# Patient Record
Sex: Male | Born: 1975 | Race: White | Hispanic: No | Marital: Single | Smoking: Current every day smoker
Health system: Southern US, Community
[De-identification: ages and names within clinical notes are randomized; demographics above are authoritative.]

## PROBLEM LIST (undated history)

## (undated) DIAGNOSIS — M1990 Unspecified osteoarthritis, unspecified site: Secondary | ICD-10-CM

## (undated) HISTORY — PX: ORIF CALCANEOUS FRACTURE: SHX5030

---

## 2008-02-07 ENCOUNTER — Emergency Department: Payer: Self-pay | Admitting: Internal Medicine

## 2010-08-24 ENCOUNTER — Emergency Department: Payer: Self-pay | Admitting: Emergency Medicine

## 2010-12-08 ENCOUNTER — Emergency Department (HOSPITAL_COMMUNITY): Payer: Self-pay

## 2010-12-08 ENCOUNTER — Inpatient Hospital Stay (HOSPITAL_COMMUNITY)
Admission: EM | Admit: 2010-12-08 | Discharge: 2010-12-14 | DRG: 493 | Disposition: A | Discharge status: Home Health | Payer: No Typology Code available for payment source | Attending: General Surgery | Admitting: General Surgery

## 2010-12-08 DIAGNOSIS — S92009A Unspecified fracture of unspecified calcaneus, initial encounter for closed fracture: Secondary | ICD-10-CM | POA: Diagnosis present

## 2010-12-08 DIAGNOSIS — F1010 Alcohol abuse, uncomplicated: Secondary | ICD-10-CM | POA: Diagnosis present

## 2010-12-08 DIAGNOSIS — F17200 Nicotine dependence, unspecified, uncomplicated: Secondary | ICD-10-CM | POA: Diagnosis present

## 2010-12-08 DIAGNOSIS — S20219A Contusion of unspecified front wall of thorax, initial encounter: Secondary | ICD-10-CM | POA: Diagnosis present

## 2010-12-08 DIAGNOSIS — S46909A Unspecified injury of unspecified muscle, fascia and tendon at shoulder and upper arm level, unspecified arm, initial encounter: Secondary | ICD-10-CM | POA: Diagnosis present

## 2010-12-08 DIAGNOSIS — S82209A Unspecified fracture of shaft of unspecified tibia, initial encounter for closed fracture: Secondary | ICD-10-CM

## 2010-12-08 DIAGNOSIS — S0003XA Contusion of scalp, initial encounter: Secondary | ICD-10-CM | POA: Diagnosis present

## 2010-12-08 DIAGNOSIS — S40029A Contusion of unspecified upper arm, initial encounter: Secondary | ICD-10-CM | POA: Diagnosis present

## 2010-12-08 DIAGNOSIS — D62 Acute posthemorrhagic anemia: Secondary | ICD-10-CM | POA: Diagnosis not present

## 2010-12-08 DIAGNOSIS — S4980XA Other specified injuries of shoulder and upper arm, unspecified arm, initial encounter: Secondary | ICD-10-CM | POA: Diagnosis present

## 2010-12-08 DIAGNOSIS — J680 Bronchitis and pneumonitis due to chemicals, gases, fumes and vapors: Secondary | ICD-10-CM | POA: Diagnosis present

## 2010-12-08 DIAGNOSIS — T5991XA Toxic effect of unspecified gases, fumes and vapors, accidental (unintentional), initial encounter: Secondary | ICD-10-CM | POA: Diagnosis present

## 2010-12-08 LAB — COMPREHENSIVE METABOLIC PANEL
Albumin: 4.3 g/dL (ref 3.5–5.2)
BUN: 14 mg/dL (ref 6–23)
Chloride: 102 mEq/L (ref 96–112)
Creatinine, Ser: 0.88 mg/dL (ref 0.50–1.35)
GFR calc Af Amer: >60 mL/min (ref >60)
GFR calc non Af Amer: >60 mL/min (ref >60)
Glucose, Bld: 123 mg/dL — ABNORMAL HIGH (ref 70–99)
Total Bilirubin: 0.6 mg/dL (ref 0.3–1.2)

## 2010-12-08 LAB — RAPID URINE DRUG SCREEN, HOSP PERFORMED
Amphetamines: NOT DETECTED
Barbiturates: NOT DETECTED
Tetrahydrocannabinol: NOT DETECTED

## 2010-12-08 LAB — PROTIME-INR: INR: 1.03 (ref 0.00–1.49)

## 2010-12-08 LAB — POCT I-STAT, CHEM 8
Calcium, Ion: 1.12 mmol/L (ref 1.12–1.32)
Glucose, Bld: 124 mg/dL — ABNORMAL HIGH (ref 70–99)
HCT: 51 % (ref 39.0–52.0)
Hemoglobin: 17.3 g/dL — ABNORMAL HIGH (ref 13.0–17.0)

## 2010-12-08 LAB — CK TOTAL AND CKMB (NOT AT ARMC): Total CK: 1780 U/L — ABNORMAL HIGH (ref 7–232)

## 2010-12-08 LAB — CBC
HCT: 46.6 % (ref 39.0–52.0)
MCHC: 36.1 g/dL — ABNORMAL HIGH (ref 30.0–36.0)
RDW: 12.2 % (ref 11.5–15.5)

## 2010-12-08 LAB — TROPONIN I: Troponin I: <0.3 ng/mL (ref <0.30)

## 2010-12-08 LAB — ETHANOL: Alcohol, Ethyl (B): <11 mg/dL (ref 0–11)

## 2010-12-08 MED ORDER — IOHEXOL 300 MG/ML  SOLN
100.0000 mL | Freq: Once | INTRAMUSCULAR | Status: AC | PRN
  Administered 2010-12-08: 100 mL via INTRAVENOUS

## 2010-12-09 ENCOUNTER — Inpatient Hospital Stay (HOSPITAL_COMMUNITY): Payer: No Typology Code available for payment source

## 2010-12-09 LAB — CBC
Platelets: 277 10*3/uL (ref 150–400)
RBC: 4.7 MIL/uL (ref 4.22–5.81)
RDW: 12.2 % (ref 11.5–15.5)
WBC: 20.1 10*3/uL — ABNORMAL HIGH (ref 4.0–10.5)

## 2010-12-11 ENCOUNTER — Inpatient Hospital Stay (HOSPITAL_COMMUNITY): Payer: No Typology Code available for payment source

## 2010-12-11 LAB — MRSA PCR SCREENING: MRSA by PCR: NEGATIVE

## 2010-12-12 LAB — CBC
MCH: 30 pg (ref 26.0–34.0)
Platelets: 298 10*3/uL (ref 150–400)
RBC: 3.97 MIL/uL — ABNORMAL LOW (ref 4.22–5.81)
RDW: 11.8 % (ref 11.5–15.5)
WBC: 16.4 10*3/uL — ABNORMAL HIGH (ref 4.0–10.5)

## 2010-12-18 NOTE — Op Note (Signed)
Anthony Santos, [name]                 ACCOUNT NO.:  [id number]  MEDICAL RECORD NO.:  [id number]  LOCATION:  5024                         FACILITY:  [facility]  PHYSICIAN:  Shlomie Romig C. Ophelia Charter, M.D.    DATE OF BIRTH:  [birth year]  DATE OF PROCEDURE:  12/11/2010 DATE OF DISCHARGE:                              OPERATIVE REPORT   PREOPERATIVE DIAGNOSES:  Right calcaneus fracture with tuberosity placement; right tibial, lateral tibial plateau, and tibial eminence fracture.  POSTOPERATIVE DIAGNOSES:  Right calcaneus fracture with tuberosity placement; right tibial, lateral tibial plateau, and tibial eminence fracture.  PROCEDURE:  ORIF, right calcaneus and arthroscopic-assisted reduction and fixation of right tibial eminence and lateral tibial plateau fracture.  SURGEON:  Felisia Balcom C. Ophelia Charter, M.D.  ANESTHESIA:  GOT plus general anesthesia.  EBL:  Minimal.  TOURNIQUET TIME:  Less than 1 hour, it was 47 minutes.  PROCEDURE IN DETAIL:  After induction of general anesthesia orotracheal intubation, preoperative Ancef prophylaxis, proximal thigh tourniquet application, standard prepping and draping from the tourniquet to the tip of the toes, gloves was placed over the toes for isolation.  Leg was wrapped in Esmarch after time-out procedure was completed and skin marker was used for planned incision.  The plantar and lateral skin border was followed, starting at the Achilles tendon even with the subtalar joint and then following along the inferior border of the calcaneus.  Tissue was cut all the way down to the bone and then elevated off the bone including peroneal tendons.  K-wires were drilled into the inferior aspect of the talus for retraction and been over x2. The posterior joint and anterior subtalar joint were both anatomic with the fragment was proximally migrated.  This was grasped, pulled down with ankle dorsiflexed made this easier, and pieces were reduced. Multiple K-wires were placed, total  of seven or eight K-wires to finally put the calcaneus back together.  Calcaneal plate was then selected Biomet and checked under fluoroscopy.  Multiple screws were then inserted along the tuberosity down and fragments in good position. After irrigation with saline solution, skin was closed with nylon sutures and impervious stockinette and Coban was applied.  Inflow was placed through the scope and arthroscopic portals were made, medial and lateral parapatellar tendon.  Medial lateral plateau fractures were checked, the lateral plateau was underneath the edge of the meniscus, meniscus was elevated, and the fragment was anatomic.  On the medial aspect, the tibial eminence fracture came to the midportion of the joint and there was no step-off either anteriorly or posteriorly.  A 1.5-cm incision was made laterally over the midportion of the plateau.  K-wire was drilled and two 5-mm screws were placed, cannulated with the more proximal one having a washer.  These were measured out of cortex drilled and screws were inserted, tightened down.  Fluoroscopic pictures taken, demonstrating reduction.  The lateral crack did not be visualized, the tibial eminence piece was supported, scope was introduced in, fragments were in good position.  Lachman was negative.  Anterior drawer was negative.  Knee was suctioned dry.  Nylon sutures placed in the portal and 3 simple nylon sutures were placed in the lateral incision for the  screw fixation.  Arthroscope was used to check position after screws were inserted and there was a compression.  The patient tolerated the procedure well; and 4x4s, Webril, Ace wraps to the knee was applied; and Xeroform 4x4s, fluffs in the large 12 inches cotton roll followed by Coban.  Instrument count and needle count was correct.  The patient tolerated procedure well.     Sanad Fearnow C. Ophelia Charter, M.D.     MCY/MEDQ  D:  12/11/2010  T:  12/11/2010  Job:  [id number]  Electronically  Signed by Annell Greening M.D. on 12/18/2010 04:10:17 PM
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
## 2010-12-20 ENCOUNTER — Telehealth (INDEPENDENT_AMBULATORY_CARE_PROVIDER_SITE_OTHER): Payer: Self-pay | Admitting: Physician Assistant

## 2010-12-20 MED ORDER — HYDROCODONE-ACETAMINOPHEN 10-325 MG PO TABS: ORAL_TABLET | ORAL | Status: AC

## 2010-12-20 NOTE — Telephone Encounter (Signed)
Spoke with pt's mother who reports pt. Is not following up with Dr. Ophelia Charter until next week and he is almost out of pain meds. Called in Norco 10/325mg  1-2 po q4hrs prn pain # 80 no refills with further meds from Dr. Ophelia Charter. Called in to pharmacy [facility] [phone]
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
## 2011-06-21 ENCOUNTER — Other Ambulatory Visit (HOSPITAL_COMMUNITY): Payer: Self-pay | Admitting: Orthopaedic Surgery

## 2011-06-21 ENCOUNTER — Encounter (HOSPITAL_COMMUNITY): Payer: Self-pay | Admitting: *Deleted

## 2011-06-21 MED ORDER — CEFAZOLIN SODIUM 1-5 GM-% IV SOLN
1.0000 g | INTRAVENOUS | Status: AC
  Administered 2011-06-22: 1 g via INTRAVENOUS
  Filled 2011-06-21: qty 50

## 2011-06-21 MED ORDER — CHLORHEXIDINE GLUCONATE 4 % EX LIQD: 60.0000 mL | Freq: Once | CUTANEOUS | Status: DC

## 2011-06-22 ENCOUNTER — Ambulatory Visit (HOSPITAL_COMMUNITY): Payer: Self-pay | Admitting: Anesthesiology

## 2011-06-22 ENCOUNTER — Encounter (HOSPITAL_COMMUNITY): Payer: Self-pay | Admitting: Anesthesiology

## 2011-06-22 ENCOUNTER — Encounter (HOSPITAL_COMMUNITY)
Admission: RE | Disposition: A | Discharge status: Home / Self Care | Payer: Self-pay | Source: Ambulatory Visit | Attending: Orthopaedic Surgery

## 2011-06-22 ENCOUNTER — Ambulatory Visit (HOSPITAL_COMMUNITY): Payer: Self-pay

## 2011-06-22 ENCOUNTER — Observation Stay (HOSPITAL_COMMUNITY)
Admission: RE | Admit: 2011-06-22 | Discharge: 2011-06-24 | Disposition: A | Discharge status: Home / Self Care | Payer: MEDICAID | Source: Ambulatory Visit | Attending: Orthopaedic Surgery | Admitting: Orthopaedic Surgery

## 2011-06-22 ENCOUNTER — Encounter (HOSPITAL_COMMUNITY): Payer: Self-pay | Admitting: *Deleted

## 2011-06-22 DIAGNOSIS — IMO0002 Reserved for concepts with insufficient information to code with codable children: Principal | ICD-10-CM | POA: Insufficient documentation

## 2011-06-22 DIAGNOSIS — S92001A Unspecified fracture of right calcaneus, initial encounter for closed fracture: Secondary | ICD-10-CM

## 2011-06-22 HISTORY — PX: ORIF CALCANEOUS FRACTURE: SHX5030

## 2011-06-22 LAB — BASIC METABOLIC PANEL
BUN: 9 mg/dL (ref 6–23)
Chloride: 104 mEq/L (ref 96–112)
GFR calc Af Amer: >90 mL/min (ref >90)
GFR calc non Af Amer: >90 mL/min (ref >90)
Potassium: 3.5 mEq/L (ref 3.5–5.1)
Sodium: 139 mEq/L (ref 135–145)

## 2011-06-22 LAB — CBC
HCT: 46.8 % (ref 39.0–52.0)
Hemoglobin: 16.8 g/dL (ref 13.0–17.0)
MCHC: 35.9 g/dL (ref 30.0–36.0)
RDW: 12.8 % (ref 11.5–15.5)
WBC: 16.3 10*3/uL — ABNORMAL HIGH (ref 4.0–10.5)

## 2011-06-22 LAB — SURGICAL PCR SCREEN
MRSA, PCR: NEGATIVE
Staphylococcus aureus: NEGATIVE

## 2011-06-22 SURGERY — OPEN REDUCTION INTERNAL FIXATION (ORIF) CALCANEOUS FRACTURE
Anesthesia: General | Site: Foot | Laterality: Right | Wound class: Clean

## 2011-06-22 MED ORDER — GLYCOPYRROLATE 0.2 MG/ML IJ SOLN
INTRAMUSCULAR | Status: DC | PRN
  Administered 2011-06-22: .5 mg via INTRAVENOUS

## 2011-06-22 MED ORDER — METOCLOPRAMIDE HCL 5 MG/ML IJ SOLN
5.0000 mg | Freq: Three times a day (TID) | INTRAMUSCULAR | Status: DC | PRN
  Filled 2011-06-22: qty 2

## 2011-06-22 MED ORDER — DIPHENHYDRAMINE HCL 12.5 MG/5ML PO ELIX
12.5000 mg | ORAL_SOLUTION | ORAL | Status: DC | PRN
  Filled 2011-06-22: qty 10

## 2011-06-22 MED ORDER — BISACODYL 10 MG RE SUPP: 10.0000 mg | Freq: Every day | RECTAL | Status: DC | PRN

## 2011-06-22 MED ORDER — DOCUSATE SODIUM 100 MG PO CAPS
100.0000 mg | ORAL_CAPSULE | Freq: Two times a day (BID) | ORAL | Status: DC
  Administered 2011-06-22 – 2011-06-24 (×5): 100 mg via ORAL
  Filled 2011-06-22 (×5): qty 1

## 2011-06-22 MED ORDER — DIPHENHYDRAMINE HCL 12.5 MG/5ML PO ELIX
12.5000 mg | ORAL_SOLUTION | Freq: Four times a day (QID) | ORAL | Status: DC | PRN
  Filled 2011-06-22: qty 5

## 2011-06-22 MED ORDER — METOCLOPRAMIDE HCL 10 MG PO TABS: 5.0000 mg | ORAL_TABLET | Freq: Three times a day (TID) | ORAL | Status: DC | PRN

## 2011-06-22 MED ORDER — NEOSTIGMINE METHYLSULFATE 1 MG/ML IJ SOLN
INTRAMUSCULAR | Status: DC | PRN
  Administered 2011-06-22: 3 mg via INTRAVENOUS

## 2011-06-22 MED ORDER — OXYCODONE-ACETAMINOPHEN 5-325 MG PO TABS
1.0000 | ORAL_TABLET | ORAL | Status: DC | PRN
  Administered 2011-06-22 – 2011-06-24 (×4): 2 via ORAL
  Filled 2011-06-22 (×4): qty 2

## 2011-06-22 MED ORDER — MORPHINE SULFATE (PF) 1 MG/ML IV SOLN
INTRAVENOUS | Status: DC
  Administered 2011-06-22: 14:00:00 via INTRAVENOUS
  Filled 2011-06-22: qty 25

## 2011-06-22 MED ORDER — BUPIVACAINE HCL (PF) 0.25 % IJ SOLN
INTRAMUSCULAR | Status: DC | PRN
  Administered 2011-06-22: 9 mL

## 2011-06-22 MED ORDER — ONDANSETRON HCL 4 MG/2ML IJ SOLN: 4.0000 mg | Freq: Four times a day (QID) | INTRAMUSCULAR | Status: DC | PRN

## 2011-06-22 MED ORDER — SENNOSIDES-DOCUSATE SODIUM 8.6-50 MG PO TABS: 1.0000 | ORAL_TABLET | Freq: Every evening | ORAL | Status: DC | PRN

## 2011-06-22 MED ORDER — HYDROMORPHONE HCL PF 1 MG/ML IJ SOLN
INTRAMUSCULAR | Status: AC
  Administered 2011-06-22: 0.5 mg via INTRAVENOUS
  Filled 2011-06-22: qty 1

## 2011-06-22 MED ORDER — METHOCARBAMOL 500 MG PO TABS
500.0000 mg | ORAL_TABLET | Freq: Four times a day (QID) | ORAL | Status: DC | PRN
  Administered 2011-06-22 – 2011-06-23 (×3): 500 mg via ORAL
  Filled 2011-06-22 (×3): qty 1

## 2011-06-22 MED ORDER — HYDROCODONE-ACETAMINOPHEN 5-325 MG PO TABS: 1.0000 | ORAL_TABLET | ORAL | Status: DC | PRN

## 2011-06-22 MED ORDER — 0.9 % SODIUM CHLORIDE (POUR BTL) OPTIME
TOPICAL | Status: DC | PRN
  Administered 2011-06-22: 1000 mL

## 2011-06-22 MED ORDER — METHOCARBAMOL 100 MG/ML IJ SOLN
500.0000 mg | INTRAVENOUS | Status: AC
  Filled 2011-06-22: qty 5

## 2011-06-22 MED ORDER — LACTATED RINGERS IV SOLN
INTRAVENOUS | Status: DC
  Administered 2011-06-22: 09:00:00 via INTRAVENOUS

## 2011-06-22 MED ORDER — NALOXONE HCL 0.4 MG/ML IJ SOLN: 0.4000 mg | INTRAMUSCULAR | Status: DC | PRN

## 2011-06-22 MED ORDER — MUPIROCIN 2 % EX OINT
TOPICAL_OINTMENT | Freq: Two times a day (BID) | CUTANEOUS | Status: DC
  Administered 2011-06-22: 1 via NASAL

## 2011-06-22 MED ORDER — MUPIROCIN 2 % EX OINT
TOPICAL_OINTMENT | CUTANEOUS | Status: AC
  Administered 2011-06-22: 1 via NASAL
  Filled 2011-06-22: qty 22

## 2011-06-22 MED ORDER — FENTANYL CITRATE 0.05 MG/ML IJ SOLN
INTRAMUSCULAR | Status: DC | PRN
  Administered 2011-06-22: 50 ug via INTRAVENOUS
  Administered 2011-06-22: 100 ug via INTRAVENOUS
  Administered 2011-06-22 (×4): 50 ug via INTRAVENOUS

## 2011-06-22 MED ORDER — METHOCARBAMOL 100 MG/ML IJ SOLN
500.0000 mg | Freq: Four times a day (QID) | INTRAVENOUS | Status: DC | PRN
  Filled 2011-06-22: qty 5

## 2011-06-22 MED ORDER — ROCURONIUM BROMIDE 100 MG/10ML IV SOLN
INTRAVENOUS | Status: DC | PRN
  Administered 2011-06-22: 5 mg via INTRAVENOUS
  Administered 2011-06-22: 50 mg via INTRAVENOUS
  Administered 2011-06-22: 10 mg via INTRAVENOUS

## 2011-06-22 MED ORDER — MIDAZOLAM HCL 5 MG/5ML IJ SOLN
INTRAMUSCULAR | Status: DC | PRN
  Administered 2011-06-22: 2 mg via INTRAVENOUS

## 2011-06-22 MED ORDER — ZOLPIDEM TARTRATE 5 MG PO TABS: 5.0000 mg | ORAL_TABLET | Freq: Every evening | ORAL | Status: DC | PRN

## 2011-06-22 MED ORDER — HYDROMORPHONE HCL PF 1 MG/ML IJ SOLN
0.2500 mg | INTRAMUSCULAR | Status: DC | PRN
  Administered 2011-06-22 (×3): 0.5 mg via INTRAVENOUS

## 2011-06-22 MED ORDER — LACTATED RINGERS IV SOLN
INTRAVENOUS | Status: DC | PRN
  Administered 2011-06-22 (×2): via INTRAVENOUS

## 2011-06-22 MED ORDER — ONDANSETRON HCL 4 MG PO TABS: 4.0000 mg | ORAL_TABLET | Freq: Four times a day (QID) | ORAL | Status: DC | PRN

## 2011-06-22 MED ORDER — MORPHINE SULFATE (PF) 1 MG/ML IV SOLN
INTRAVENOUS | Status: DC
  Administered 2011-06-22: 11.43 mg via INTRAVENOUS
  Administered 2011-06-23: 09:00:00 via INTRAVENOUS
  Administered 2011-06-23: 6 mg via INTRAVENOUS
  Administered 2011-06-23: 3 mg via INTRAVENOUS
  Administered 2011-06-23: 12 mg via INTRAVENOUS
  Administered 2011-06-23: 3 mg via INTRAVENOUS
  Filled 2011-06-22 (×3): qty 25

## 2011-06-22 MED ORDER — PROPOFOL 10 MG/ML IV EMUL
INTRAVENOUS | Status: DC | PRN
  Administered 2011-06-22: 200 mg via INTRAVENOUS

## 2011-06-22 MED ORDER — DIPHENHYDRAMINE HCL 50 MG/ML IJ SOLN
12.5000 mg | Freq: Four times a day (QID) | INTRAMUSCULAR | Status: DC | PRN
  Administered 2011-06-23: 12.5 mg via INTRAVENOUS
  Filled 2011-06-22 (×2): qty 1

## 2011-06-22 MED ORDER — SODIUM CHLORIDE 0.9 % IJ SOLN: 9.0000 mL | INTRAMUSCULAR | Status: DC | PRN

## 2011-06-22 MED ORDER — CEFAZOLIN SODIUM 1-5 GM-% IV SOLN
1.0000 g | Freq: Four times a day (QID) | INTRAVENOUS | Status: AC
  Administered 2011-06-22 – 2011-06-23 (×3): 1 g via INTRAVENOUS
  Filled 2011-06-22 (×3): qty 50

## 2011-06-22 SURGICAL SUPPLY — 78 items
BANDAGE ELASTIC 4 VELCRO ST LF (GAUZE/BANDAGES/DRESSINGS) ×2 IMPLANT
BANDAGE ELASTIC 6 VELCRO ST LF (GAUZE/BANDAGES/DRESSINGS) ×2 IMPLANT
BANDAGE ESMARK 6X9 LF (GAUZE/BANDAGES/DRESSINGS) ×1 IMPLANT
BIT DRILL 2.5X2.75 QC CALB (BIT) ×2 IMPLANT
BIT DRILL 2.9 CANN QC NONSTRL (BIT) ×2 IMPLANT
BIT DRILL CALIBRATED 2.7 (BIT) ×2 IMPLANT
BNDG COHESIVE 6X5 TAN STRL LF (GAUZE/BANDAGES/DRESSINGS) ×2 IMPLANT
BNDG ESMARK 6X9 LF (GAUZE/BANDAGES/DRESSINGS) ×2
BONE CANC CHIPS 20CC PCAN1/4 (Bone Implant) ×2 IMPLANT
CHIPS CANC BONE 20CC PCAN1/4 (Bone Implant) ×1 IMPLANT
CLOTH BEACON ORANGE TIMEOUT ST (SAFETY) ×2 IMPLANT
COTTONBALL LARGE BULK UNSTER (GAUZE/BANDAGES/DRESSINGS) ×2 IMPLANT
COVER MAYO STAND STRL (DRAPES) ×2 IMPLANT
COVER SURGICAL LIGHT HANDLE (MISCELLANEOUS) ×4 IMPLANT
CUFF TOURNIQUET SINGLE 34IN LL (TOURNIQUET CUFF) ×2 IMPLANT
CUFF TOURNIQUET SINGLE 44IN (TOURNIQUET CUFF) IMPLANT
DRAPE C-ARM 42X72 X-RAY (DRAPES) ×2 IMPLANT
DRAPE INCISE IOBAN 66X45 STRL (DRAPES) ×4 IMPLANT
DRAPE PROXIMA HALF (DRAPES) IMPLANT
DRAPE SURG 17X23 STRL (DRAPES) ×6 IMPLANT
DRAPE U-SHAPE 47X51 STRL (DRAPES) ×2 IMPLANT
DRSG PAD ABDOMINAL 8X10 ST (GAUZE/BANDAGES/DRESSINGS) ×2 IMPLANT
DURAPREP 26ML APPLICATOR (WOUND CARE) ×2 IMPLANT
ELECT CAUTERY BLADE 6.4 (BLADE) ×2 IMPLANT
ELECT REM PT RETURN 9FT ADLT (ELECTROSURGICAL) ×2
ELECTRODE REM PT RTRN 9FT ADLT (ELECTROSURGICAL) ×1 IMPLANT
GAUZE SPONGE 4X4 12PLY STRL LF (GAUZE/BANDAGES/DRESSINGS) ×2 IMPLANT
GAUZE XEROFORM 5X9 LF (GAUZE/BANDAGES/DRESSINGS) ×2 IMPLANT
GLOVE BIOGEL PI IND STRL 7.0 (GLOVE) ×1 IMPLANT
GLOVE BIOGEL PI IND STRL 7.5 (GLOVE) ×1 IMPLANT
GLOVE BIOGEL PI IND STRL 8 (GLOVE) ×1 IMPLANT
GLOVE BIOGEL PI INDICATOR 7.0 (GLOVE) ×1
GLOVE BIOGEL PI INDICATOR 7.5 (GLOVE) ×1
GLOVE BIOGEL PI INDICATOR 8 (GLOVE) ×1
GLOVE ECLIPSE 7.0 STRL STRAW (GLOVE) ×2 IMPLANT
GLOVE EXAM NITRILE XS STR PU (GLOVE) ×2 IMPLANT
GLOVE ORTHO TXT STRL SZ7.5 (GLOVE) ×2 IMPLANT
GLOVE SURG SS PI 6.5 STRL IVOR (GLOVE) ×2 IMPLANT
GOWN PREVENTION PLUS LG XLONG (DISPOSABLE) ×2 IMPLANT
GOWN PREVENTION PLUS XLARGE (GOWN DISPOSABLE) ×2 IMPLANT
GOWN STRL NON-REIN LRG LVL3 (GOWN DISPOSABLE) ×4 IMPLANT
K-WIRE ACE 1.6X6 (WIRE) ×6
KIT BASIN OR (CUSTOM PROCEDURE TRAY) ×2 IMPLANT
KIT ROOM TURNOVER OR (KITS) ×2 IMPLANT
KWIRE ACE 1.6X6 (WIRE) ×3 IMPLANT
MANIFOLD NEPTUNE II (INSTRUMENTS) ×2 IMPLANT
NEEDLE HYPO 25GX1X1/2 BEV (NEEDLE) ×2 IMPLANT
NS IRRIG 1000ML POUR BTL (IV SOLUTION) ×2 IMPLANT
PACK ORTHO EXTREMITY (CUSTOM PROCEDURE TRAY) ×2 IMPLANT
PAD ARMBOARD 7.5X6 YLW CONV (MISCELLANEOUS) ×4 IMPLANT
PAD CAST 4YDX4 CTTN HI CHSV (CAST SUPPLIES) IMPLANT
PADDING CAST COTTON 4X4 STRL (CAST SUPPLIES)
PADDING CAST COTTON 6X4 STRL (CAST SUPPLIES) IMPLANT
PADDING WEBRIL 4 STERILE (GAUZE/BANDAGES/DRESSINGS) ×2 IMPLANT
PADDING WEBRIL 6 STERILE (GAUZE/BANDAGES/DRESSINGS) ×2 IMPLANT
PLATE LOCK SM RT CALC FOOT (Plate) ×2 IMPLANT
SCREW ACE CAN 4.0 60M (Screw) ×2 IMPLANT
SCREW CORT 3.5X40 (Screw) ×4 IMPLANT
SCREW LOCK CORT STAR 3.5X10 (Screw) ×2 IMPLANT
SCREW LOCK CORT STAR 3.5X30 (Screw) ×2 IMPLANT
SCREW LOCK CORT STAR 3.5X32 (Screw) ×4 IMPLANT
SCREW LOCK CORT STAR 3.5X34 (Screw) ×2 IMPLANT
SCREW LOCK CORT STAR 3.5X40 (Screw) ×2 IMPLANT
SCREW LOW PROFILE 3.5MMX42 (Screw) ×2 IMPLANT
SCREW LP 3.5 (Screw) ×2 IMPLANT
SPONGE GAUZE 4X4 12PLY (GAUZE/BANDAGES/DRESSINGS) ×2 IMPLANT
SPONGE LAP 18X18 X RAY DECT (DISPOSABLE) ×2 IMPLANT
STAPLER VISISTAT 35W (STAPLE) IMPLANT
SUCTION FRAZIER TIP 10 FR DISP (SUCTIONS) ×2 IMPLANT
SUT ETHILON 3 0 PS 1 (SUTURE) ×6 IMPLANT
SUT VIC AB 2-0 CT1 27 (SUTURE) ×1
SUT VIC AB 2-0 CT1 TAPERPNT 27 (SUTURE) ×1 IMPLANT
SYR CONTROL 10ML LL (SYRINGE) ×2 IMPLANT
TOWEL OR 17X24 6PK STRL BLUE (TOWEL DISPOSABLE) ×2 IMPLANT
TOWEL OR 17X26 10 PK STRL BLUE (TOWEL DISPOSABLE) ×2 IMPLANT
TUBE CONNECTING 12X1/4 (SUCTIONS) ×2 IMPLANT
WATER STERILE IRR 1000ML POUR (IV SOLUTION) IMPLANT
YANKAUER SUCT BULB TIP NO VENT (SUCTIONS) ×2 IMPLANT

## 2011-06-22 NOTE — Preoperative (Signed)
Beta Blockers   Reason not to administer Beta Blockers:Not Applicable 

## 2011-06-22 NOTE — Anesthesia Preprocedure Evaluation (Addendum)
Anesthesia Evaluation  Patient identified by MRN, date of birth, ID band Patient awake    Reviewed: Allergy & Precautions, H&P , NPO status , Patient's Chart, lab work & pertinent test results  Airway Mallampati: II  Neck ROM: full    Dental   Pulmonary          Cardiovascular     Neuro/Psych    GI/Hepatic   Endo/Other    Renal/GU      Musculoskeletal   Abdominal   Peds  Hematology   Anesthesia Other Findings   Reproductive/Obstetrics                           Anesthesia Physical Anesthesia Plan  ASA: I  Anesthesia Plan: General   Post-op Pain Management:    Induction: Intravenous  Airway Management Planned: Oral ETT  Additional Equipment:   Intra-op Plan:   Post-operative Plan: Extubation in OR  Informed Consent: I have reviewed the patients History and Physical, chart, labs and discussed the procedure including the risks, benefits and alternatives for the proposed anesthesia with the patient or authorized representative who has indicated his/her understanding and acceptance.   Dental advisory given  Plan Discussed with: CRNA, Surgeon and Anesthesiologist  Anesthesia Plan Comments:        Anesthesia Quick Evaluation
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
## 2011-06-22 NOTE — Anesthesia Procedure Notes (Signed)
Procedure Name: Intubation Date/Time: 06/22/2011 10:08 AM Performed by: Julianne Rice K Pre-anesthesia Checklist: Patient identified, Timeout performed, Emergency Drugs available, Suction available and Patient being monitored Patient Re-evaluated:Patient Re-evaluated prior to inductionOxygen Delivery Method: Circle System Utilized Preoxygenation: Pre-oxygenation with 100% oxygen Intubation Type: IV induction Ventilation: Mask ventilation without difficulty Laryngoscope Size: Mac and 4 Grade View: Grade I Tube type: Oral Tube size: 8.0 mm Number of attempts: 1 Airway Equipment and Method: stylet Placement Confirmation: ETT inserted through vocal cords under direct vision,  positive ETCO2 and breath sounds checked- equal and bilateral Secured at: 24 cm Tube secured with: Tape Dental Injury: Teeth and Oropharynx as per pre-operative assessment

## 2011-06-22 NOTE — Brief Op Note (Signed)
06/22/2011  12:02 PM  PATIENT:  Anthony Santos  36 y.o. male  PRE-OPERATIVE DIAGNOSIS:  Right Calcaneus Nonunion  POST-OPERATIVE DIAGNOSIS:  Right Calcaneus Nonunion  PROCEDURE:  Procedure(s): OPEN REDUCTION INTERNAL FIXATION (ORIF) CALCANEOUS FRACTURE TAKE DOWN NONUNION AND BONE GRAFTING   SURGEON:  Surgeon(s): Eldred Manges, MD  PHYSICIAN ASSISTANT: Mahek Schlesinger PAC  ASSISTANTS: none   ANESTHESIA:   general  EBL:  Total I/O In: 1000 [I.V.:1000] Out: 50 [Blood:50]  BLOOD ADMINISTERED:none  DRAINS: none   LOCAL MEDICATIONS USED:  NONE  SPECIMEN:  No Specimen  DISPOSITION OF SPECIMEN:  N/A  COUNTS:  YES  TOURNIQUET:   Total Tourniquet Time Documented: Thigh (Right) - 85 minutes  DICTATION: .Note written in EPIC  PLAN OF CARE: Admit for overnight observation  PATIENT DISPOSITION:  PACU - hemodynamically stable.   Delay start of Pharmacological VTE agent (>24hrs) due to surgical blood loss or risk of bleeding:  {YES/NO/NOT APPLICABLE:20182

## 2011-06-22 NOTE — Anesthesia Postprocedure Evaluation (Signed)
Anesthesia Post Note  Patient: Anthony Santos  Procedure(s) Performed:  OPEN REDUCTION INTERNAL FIXATION (ORIF) CALCANEOUS FRACTURE - Takedown Nonunion Right Calcaneus  Anesthesia type: General  Patient location: PACU  Post pain: Pain level controlled and Adequate analgesia  Post assessment: Post-op Vital signs reviewed, Patient's Cardiovascular Status Stable, Respiratory Function Stable, Patent Airway and Pain level controlled  Last Vitals:  Filed Vitals:   06/22/11 1305  BP: 130/69  Pulse: 88  Temp:   Resp: 21    Post vital signs: Reviewed and stable  Level of consciousness: awake, alert  and oriented  Complications: No apparent anesthesia complications

## 2011-06-22 NOTE — Transfer of Care (Signed)
Immediate Anesthesia Transfer of Care Note  Patient: Anthony Santos  Procedure(s) Performed:  OPEN REDUCTION INTERNAL FIXATION (ORIF) CALCANEOUS FRACTURE - Takedown Nonunion Right Calcaneus  Patient Location: PACU  Anesthesia Type: General  Level of Consciousness: awake, alert  and oriented  Airway & Oxygen Therapy: Patient Spontanous Breathing and Patient connected to nasal cannula oxygen  Post-op Assessment: Report given to PACU RN, Post -op Vital signs reviewed and stable and Patient moving all extremities  Post vital signs: Reviewed and stable  Complications: No apparent anesthesia complications

## 2011-06-23 MED ORDER — MEPROBAMATE 200 MG PO TABS
200.0000 mg | ORAL_TABLET | Freq: Four times a day (QID) | ORAL | Status: DC
  Administered 2011-06-23 – 2011-06-24 (×3): 200 mg via ORAL
  Filled 2011-06-23 (×4): qty 1

## 2011-06-23 MED ORDER — ASPIRIN-ACETAMINOPHEN-CAFFEINE 250-250-65 MG PO TABS
1.0000 | ORAL_TABLET | Freq: Four times a day (QID) | ORAL | Status: DC | PRN
  Administered 2011-06-23: 1 via ORAL
  Filled 2011-06-23 (×2): qty 1

## 2011-06-23 NOTE — Progress Notes (Signed)
Subjective: 1 Day Post-Op Procedure(s) (LRB): OPEN REDUCTION INTERNAL FIXATION (ORIF) CALCANEOUS FRACTURE (Right)  Patient reports pain as moderate. Wants to know when he is to go home.    Objective:   VITALS:  Temp:  [97.4 F (36.3 C)-100.3 F (37.9 C)] 100.3 F (37.9 C) (01/26 0705) Pulse Rate:  [81-116] 94  (01/26 0705) Resp:  [12-27] 16  (01/26 0921) BP: (100-130)/(64-82) 100/68 mmHg (01/26 0705) SpO2:  [92 %-100 %] 97 % (01/26 0921)  Neurologically intact ABD soft Sensation intact distally Intact pulses distally Dorsiflexion/Plantar flexion intact Compartment soft   LABS  Basename 06/22/11 0730  HGB 16.8  WBC 16.3*  PLT 274    Basename 06/22/11 0730  NA 139  K 3.5  CL 104  CO2 22  BUN 9  CREATININE 0.83  GLUCOSE 99   No results found for this basename: LABPT:2,INR:2 in the last 72 hours   Assessment/Plan: 1 Day Post-Op Procedure(s) (LRB): OPEN REDUCTION INTERNAL FIXATION (ORIF) CALCANEOUS FRACTURE (Right)  Advance diet Up with therapy D/C IV fluids Plan for discharge tomorrow  Maryhelen Lindler E 06/23/2011, 12:13 PM

## 2011-06-24 MED ORDER — HYDROCODONE-ACETAMINOPHEN 5-325 MG PO TABS: 1.0000 | ORAL_TABLET | ORAL | Status: AC | PRN

## 2011-06-24 NOTE — Progress Notes (Signed)
Subjective: 2 Days Post-Op Procedure(s) (LRB): OPEN REDUCTION INTERNAL FIXATION (ORIF) CALCANEOUS FRACTURE (Right)  Patient reports pain as moderate.    Objective:   VITALS:  Temp:  [97.7 F (36.5 C)-99 F (37.2 C)] 98.8 F (37.1 C) (01/27 0515) Pulse Rate:  [76-86] 76  (01/27 0515) Resp:  [16-18] 18  (01/27 0515) BP: (101-111)/(56-73) 111/73 mmHg (01/27 0515) SpO2:  [9 %-99 %] 9 % (01/27 0515)  Neurologically intact ABD soft Neurovascular intact Sensation intact distally Dorsiflexion/Plantar flexion intact Incision: dressing C/D/I   LABS  Basename 06/22/11 0730  HGB 16.8  WBC 16.3*  PLT 274    Basename 06/22/11 0730  NA 139  K 3.5  CL 104  CO2 22  BUN 9  CREATININE 0.83  GLUCOSE 99   No results found for this basename: LABPT:2,INR:2 in the last 72 hours   Assessment/Plan: 2 Days Post-Op Procedure(s) (LRB): OPEN REDUCTION INTERNAL FIXATION (ORIF) CALCANEOUS FRACTURE (Right)  Up with therapy D/C IV fluids Discharge home with home health  Mylei Brackeen E 06/24/2011, 11:46 AM

## 2011-06-24 NOTE — Progress Notes (Signed)
Orthopedic Tech Progress Note Patient Details:  Anthony Santos April 03, 1976 [id number]     Trapeze bar [name], [name] 06/24/2011, 11:09 AM
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
## 2011-06-24 NOTE — Progress Notes (Signed)
Orthopedic Tech Progress Note Patient Details:  Anthony Santos [birth year] [id number]  Other Ortho Devices Type of Ortho Device: Crutches Ortho Device Interventions: Adjustment   [name], [name] 06/24/2011, 1:07 PM

## 2011-06-26 ENCOUNTER — Encounter (HOSPITAL_COMMUNITY): Payer: Self-pay | Admitting: Orthopaedic Surgery
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
## 2011-07-24 ENCOUNTER — Other Ambulatory Visit (HOSPITAL_COMMUNITY): Payer: Self-pay | Admitting: Orthopedic Surgery

## 2011-07-24 ENCOUNTER — Encounter (HOSPITAL_COMMUNITY): Payer: Self-pay | Admitting: Pharmacy Technician

## 2011-07-25 ENCOUNTER — Encounter (HOSPITAL_COMMUNITY): Payer: Self-pay | Admitting: *Deleted

## 2011-07-25 MED ORDER — CEFAZOLIN SODIUM 1-5 GM-% IV SOLN
1.0000 g | INTRAVENOUS | Status: AC
  Administered 2011-07-26: 1 g via INTRAVENOUS
  Filled 2011-07-25: qty 50

## 2011-07-26 ENCOUNTER — Encounter (HOSPITAL_COMMUNITY): Payer: Self-pay | Admitting: Anesthesiology

## 2011-07-26 ENCOUNTER — Encounter (HOSPITAL_COMMUNITY): Payer: Self-pay | Admitting: *Deleted

## 2011-07-26 ENCOUNTER — Encounter (HOSPITAL_COMMUNITY)
Admission: RE | Disposition: A | Discharge status: Home / Self Care | Payer: Self-pay | Source: Ambulatory Visit | Attending: Orthopedic Surgery

## 2011-07-26 ENCOUNTER — Other Ambulatory Visit: Payer: Self-pay

## 2011-07-26 ENCOUNTER — Ambulatory Visit (HOSPITAL_COMMUNITY): Payer: No Typology Code available for payment source | Admitting: Anesthesiology

## 2011-07-26 ENCOUNTER — Inpatient Hospital Stay (HOSPITAL_COMMUNITY)
Admission: RE | Admit: 2011-07-26 | Discharge: 2011-07-30 | DRG: 904 | Disposition: A | Discharge status: Home / Self Care | Payer: No Typology Code available for payment source | Source: Ambulatory Visit | Attending: Orthopedic Surgery | Admitting: Orthopedic Surgery

## 2011-07-26 DIAGNOSIS — M86179 Other acute osteomyelitis, unspecified ankle and foot: Secondary | ICD-10-CM

## 2011-07-26 DIAGNOSIS — T81329A Deep disruption or dehiscence of operation wound, unspecified, initial encounter: Principal | ICD-10-CM | POA: Diagnosis present

## 2011-07-26 DIAGNOSIS — M869 Osteomyelitis, unspecified: Secondary | ICD-10-CM | POA: Diagnosis present

## 2011-07-26 DIAGNOSIS — F17200 Nicotine dependence, unspecified, uncomplicated: Secondary | ICD-10-CM | POA: Diagnosis present

## 2011-07-26 DIAGNOSIS — Y838 Other surgical procedures as the cause of abnormal reaction of the patient, or of later complication, without mention of misadventure at the time of the procedure: Secondary | ICD-10-CM | POA: Diagnosis present

## 2011-07-26 DIAGNOSIS — T8132XA Disruption of internal operation (surgical) wound, not elsewhere classified, initial encounter: Principal | ICD-10-CM | POA: Diagnosis present

## 2011-07-26 HISTORY — PX: HARDWARE REMOVAL: SHX979

## 2011-07-26 LAB — CBC
Hemoglobin: 16 g/dL (ref 13.0–17.0)
MCH: 30.3 pg (ref 26.0–34.0)
MCHC: 35.2 g/dL (ref 30.0–36.0)
RDW: 12.9 % (ref 11.5–15.5)

## 2011-07-26 LAB — COMPREHENSIVE METABOLIC PANEL
ALT: 38 U/L (ref 0–53)
Albumin: 3.9 g/dL (ref 3.5–5.2)
BUN: 10 mg/dL (ref 6–23)
Calcium: 10 mg/dL (ref 8.4–10.5)
GFR calc Af Amer: >90 mL/min (ref >90)
Glucose, Bld: 97 mg/dL (ref 70–99)
Potassium: 3.9 mEq/L (ref 3.5–5.1)
Sodium: 138 mEq/L (ref 135–145)
Total Protein: 7.3 g/dL (ref 6.0–8.3)

## 2011-07-26 LAB — PROTIME-INR: Prothrombin Time: 13.3 seconds (ref 11.6–15.2)

## 2011-07-26 LAB — APTT: aPTT: 25 seconds (ref 24–37)

## 2011-07-26 SURGERY — REMOVAL, HARDWARE
Anesthesia: General | Site: Foot | Laterality: Right | Wound class: Dirty or Infected

## 2011-07-26 MED ORDER — MORPHINE SULFATE 10 MG/ML IJ SOLN
INTRAMUSCULAR | Status: DC | PRN
  Administered 2011-07-26: 1 mg via INTRAVENOUS
  Administered 2011-07-26: 4 mg via INTRAVENOUS

## 2011-07-26 MED ORDER — HYDROMORPHONE HCL PF 1 MG/ML IJ SOLN
0.2500 mg | INTRAMUSCULAR | Status: DC | PRN
  Administered 2011-07-26 (×2): 0.5 mg via INTRAVENOUS

## 2011-07-26 MED ORDER — MIDAZOLAM HCL 5 MG/5ML IJ SOLN
INTRAMUSCULAR | Status: DC | PRN
  Administered 2011-07-26: 2 mg via INTRAVENOUS

## 2011-07-26 MED ORDER — WARFARIN SODIUM 10 MG PO TABS
10.0000 mg | ORAL_TABLET | ORAL | Status: AC
  Administered 2011-07-27: 10 mg via ORAL
  Filled 2011-07-26: qty 1

## 2011-07-26 MED ORDER — VANCOMYCIN HCL 500 MG IV SOLR
500.0000 mg | INTRAVENOUS | Status: DC
Start: 2011-07-26 — End: 2011-07-26
  Filled 2011-07-26: qty 500

## 2011-07-26 MED ORDER — OXYCODONE-ACETAMINOPHEN 5-325 MG PO TABS
1.0000 | ORAL_TABLET | ORAL | Status: DC | PRN
  Administered 2011-07-29 (×2): 2 via ORAL
  Filled 2011-07-26 (×2): qty 2

## 2011-07-26 MED ORDER — CEFAZOLIN SODIUM 1-5 GM-% IV SOLN
1.0000 g | Freq: Four times a day (QID) | INTRAVENOUS | Status: AC
  Administered 2011-07-27 (×3): 1 g via INTRAVENOUS
  Filled 2011-07-26 (×3): qty 50

## 2011-07-26 MED ORDER — ONDANSETRON HCL 4 MG/2ML IJ SOLN: 4.0000 mg | Freq: Once | INTRAMUSCULAR | Status: DC | PRN

## 2011-07-26 MED ORDER — MORPHINE SULFATE 4 MG/ML IJ SOLN: 0.0500 mg/kg | INTRAMUSCULAR | Status: DC | PRN

## 2011-07-26 MED ORDER — ONDANSETRON HCL 4 MG/2ML IJ SOLN: 4.0000 mg | Freq: Four times a day (QID) | INTRAMUSCULAR | Status: DC | PRN

## 2011-07-26 MED ORDER — METOCLOPRAMIDE HCL 5 MG/ML IJ SOLN
5.0000 mg | Freq: Three times a day (TID) | INTRAMUSCULAR | Status: DC | PRN
Start: 2011-07-26 — End: 2011-07-30
  Administered 2011-07-27: 10 mg via INTRAVENOUS
  Filled 2011-07-26: qty 2

## 2011-07-26 MED ORDER — WARFARIN VIDEO
Freq: Once | Status: AC
  Administered 2011-07-26: 23:00:00

## 2011-07-26 MED ORDER — GENTAMICIN SULFATE 40 MG/ML IJ SOLN
INTRAMUSCULAR | Status: DC | PRN
  Administered 2011-07-26: 80 mg

## 2011-07-26 MED ORDER — HYDROCODONE-ACETAMINOPHEN 5-325 MG PO TABS
1.0000 | ORAL_TABLET | ORAL | Status: DC | PRN
  Administered 2011-07-27: 2 via ORAL
  Administered 2011-07-27: 1 via ORAL
  Administered 2011-07-27 – 2011-07-30 (×8): 2 via ORAL
  Filled 2011-07-26: qty 1
  Filled 2011-07-26 (×9): qty 2

## 2011-07-26 MED ORDER — ONDANSETRON HCL 4 MG PO TABS
4.0000 mg | ORAL_TABLET | Freq: Four times a day (QID) | ORAL | Status: DC | PRN
  Administered 2011-07-27: 4 mg via ORAL
  Filled 2011-07-26: qty 1

## 2011-07-26 MED ORDER — MEPERIDINE HCL 25 MG/ML IJ SOLN
6.2500 mg | INTRAMUSCULAR | Status: DC | PRN
Start: 2011-07-26 — End: 2011-07-26

## 2011-07-26 MED ORDER — LACTATED RINGERS IV SOLN
INTRAVENOUS | Status: DC | PRN
  Administered 2011-07-26: 19:00:00 via INTRAVENOUS

## 2011-07-26 MED ORDER — PROPOFOL 10 MG/ML IV BOLUS
INTRAVENOUS | Status: DC | PRN
  Administered 2011-07-26: 200 mg via INTRAVENOUS

## 2011-07-26 MED ORDER — FENTANYL CITRATE 0.05 MG/ML IJ SOLN
INTRAMUSCULAR | Status: DC | PRN
  Administered 2011-07-26: 50 ug via INTRAVENOUS
  Administered 2011-07-26 (×2): 100 ug via INTRAVENOUS

## 2011-07-26 MED ORDER — COUMADIN BOOK
Freq: Once | Status: AC
  Administered 2011-07-27: 01:00:00
  Filled 2011-07-26: qty 1

## 2011-07-26 MED ORDER — VANCOMYCIN HCL 500 MG IV SOLR
INTRAVENOUS | Status: DC | PRN
  Administered 2011-07-26: 500 mg

## 2011-07-26 MED ORDER — SODIUM CHLORIDE 0.9 % IV SOLN
INTRAVENOUS | Status: DC
  Administered 2011-07-27: 20 mL via INTRAVENOUS

## 2011-07-26 MED ORDER — METOCLOPRAMIDE HCL 10 MG PO TABS
5.0000 mg | ORAL_TABLET | Freq: Three times a day (TID) | ORAL | Status: DC | PRN
Start: 2011-07-26 — End: 2011-07-30

## 2011-07-26 MED ORDER — HYDROMORPHONE HCL PF 1 MG/ML IJ SOLN
0.5000 mg | INTRAMUSCULAR | Status: DC | PRN
  Administered 2011-07-27 – 2011-07-28 (×3): 1 mg via INTRAVENOUS
  Filled 2011-07-26 (×3): qty 1

## 2011-07-26 SURGICAL SUPPLY — 54 items
BANDAGE ELASTIC 4 VELCRO ST LF (GAUZE/BANDAGES/DRESSINGS) IMPLANT
BANDAGE ELASTIC 6 VELCRO ST LF (GAUZE/BANDAGES/DRESSINGS) IMPLANT
BANDAGE ESMARK 6X9 LF (GAUZE/BANDAGES/DRESSINGS) IMPLANT
BANDAGE GAUZE ELAST BULKY 4 IN (GAUZE/BANDAGES/DRESSINGS) ×2 IMPLANT
BNDG COHESIVE 4X5 TAN STRL (GAUZE/BANDAGES/DRESSINGS) IMPLANT
BNDG ESMARK 6X9 LF (GAUZE/BANDAGES/DRESSINGS)
CANISTER WOUND CARE 500ML ATS (WOUND CARE) ×2 IMPLANT
CLOTH BEACON ORANGE TIMEOUT ST (SAFETY) ×2 IMPLANT
COVER SURGICAL LIGHT HANDLE (MISCELLANEOUS) ×2 IMPLANT
CUFF TOURNIQUET SINGLE 34IN LL (TOURNIQUET CUFF) IMPLANT
CUFF TOURNIQUET SINGLE 44IN (TOURNIQUET CUFF) IMPLANT
DRAPE C-ARM 42X72 X-RAY (DRAPES) IMPLANT
DRAPE INCISE IOBAN 66X45 STRL (DRAPES) IMPLANT
DRAPE OEC MINIVIEW 54X84 (DRAPES) ×2 IMPLANT
DRAPE ORTHO SPLIT 77X108 STRL (DRAPES)
DRAPE SURG ORHT 6 SPLT 77X108 (DRAPES) IMPLANT
DRSG EMULSION OIL 3X3 NADH (GAUZE/BANDAGES/DRESSINGS) ×2 IMPLANT
DRSG PAD ABDOMINAL 8X10 ST (GAUZE/BANDAGES/DRESSINGS) ×2 IMPLANT
DRSG VAC ATS SM SENSATRAC (GAUZE/BANDAGES/DRESSINGS) ×2 IMPLANT
ELECT REM PT RETURN 9FT ADLT (ELECTROSURGICAL) ×2
ELECTRODE REM PT RTRN 9FT ADLT (ELECTROSURGICAL) ×1 IMPLANT
GLOVE BIOGEL PI IND STRL 9 (GLOVE) ×1 IMPLANT
GLOVE BIOGEL PI INDICATOR 9 (GLOVE) ×1
GLOVE SURG ORTHO 9.0 STRL STRW (GLOVE) ×2 IMPLANT
GOWN PREVENTION PLUS XLARGE (GOWN DISPOSABLE) ×2 IMPLANT
GOWN SRG XL XLNG 56XLVL 4 (GOWN DISPOSABLE) ×2 IMPLANT
GOWN STRL NON-REIN XL XLG LVL4 (GOWN DISPOSABLE) ×2
KIT BASIN OR (CUSTOM PROCEDURE TRAY) ×2 IMPLANT
KIT ROOM TURNOVER OR (KITS) ×2 IMPLANT
KIT STIMULAN RAPID CURE  10CC (Orthopedic Implant) ×1 IMPLANT
KIT STIMULAN RAPID CURE 10CC (Orthopedic Implant) ×1 IMPLANT
MANIFOLD NEPTUNE II (INSTRUMENTS) ×2 IMPLANT
MATRIX SURGICAL PSM 7X10CM (Tissue) ×2 IMPLANT
MICROMATRIX 500MG (Tissue) ×2 IMPLANT
NS IRRIG 1000ML POUR BTL (IV SOLUTION) ×2 IMPLANT
PACK ORTHO EXTREMITY (CUSTOM PROCEDURE TRAY) ×2 IMPLANT
PAD ARMBOARD 7.5X6 YLW CONV (MISCELLANEOUS) ×4 IMPLANT
PAD CAST 4YDX4 CTTN HI CHSV (CAST SUPPLIES) ×1 IMPLANT
PADDING CAST COTTON 4X4 STRL (CAST SUPPLIES) ×1
SOLUTION PARTIC MCRMTRX 500MG (Tissue) ×1 IMPLANT
SPONGE GAUZE 4X4 12PLY (GAUZE/BANDAGES/DRESSINGS) ×2 IMPLANT
SPONGE LAP 18X18 X RAY DECT (DISPOSABLE) ×4 IMPLANT
STAPLER VISISTAT 35W (STAPLE) IMPLANT
STOCKINETTE IMPERVIOUS 9X36 MD (GAUZE/BANDAGES/DRESSINGS) IMPLANT
SUT ETHILON 2 0 PSLX (SUTURE) ×4 IMPLANT
SUT VIC AB 0 CT1 27 (SUTURE)
SUT VIC AB 0 CT1 27XBRD ANBCTR (SUTURE) IMPLANT
SUT VIC AB 2-0 CT1 27 (SUTURE)
SUT VIC AB 2-0 CT1 TAPERPNT 27 (SUTURE) IMPLANT
TOWEL OR 17X24 6PK STRL BLUE (TOWEL DISPOSABLE) ×2 IMPLANT
TOWEL OR 17X26 10 PK STRL BLUE (TOWEL DISPOSABLE) ×2 IMPLANT
TUBE CONNECTING 12X1/4 (SUCTIONS) ×2 IMPLANT
WATER STERILE IRR 1000ML POUR (IV SOLUTION) ×2 IMPLANT
YANKAUER SUCT BULB TIP NO VENT (SUCTIONS) ×2 IMPLANT
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
## 2011-07-26 NOTE — Preoperative (Signed)
Beta Blockers   Reason not to administer Beta Blockers:Not Applicable 

## 2011-07-26 NOTE — Anesthesia Preprocedure Evaluation (Addendum)
Anesthesia Evaluation  Patient identified by MRN, date of birth, ID band Patient awake    Reviewed: Allergy & Precautions, H&P , NPO status , Patient's Chart, lab work & pertinent test results  Airway Mallampati: I TM Distance: >3 FB Neck ROM: Full    Dental  (+) Teeth Intact and Dental Advisory Given   Pulmonary Current Smoker,  clear to auscultation        Cardiovascular Regular Normal    Neuro/Psych    GI/Hepatic   Endo/Other    Renal/GU      Musculoskeletal   Abdominal   Peds  Hematology   Anesthesia Other Findings   Reproductive/Obstetrics                          Anesthesia Physical Anesthesia Plan  ASA: II  Anesthesia Plan: General   Post-op Pain Management:    Induction: Intravenous  Airway Management Planned: LMA  Additional Equipment:   Intra-op Plan:   Post-operative Plan: Extubation in OR  Informed Consent: I have reviewed the patients History and Physical, chart, labs and discussed the procedure including the risks, benefits and alternatives for the proposed anesthesia with the patient or authorized representative who has indicated his/her understanding and acceptance.   Dental advisory given  Plan Discussed with: Anesthesiologist, CRNA and Surgeon  Anesthesia Plan Comments:         Anesthesia Quick Evaluation

## 2011-07-26 NOTE — Op Note (Signed)
OPERATIVE REPORT  DATE OF SURGERY: 07/26/2011  PATIENT:  Anthony Santos,  36 y.o. 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 #2 removal of deep retained hardware. #3 application of stimulant antibiotic beads with vancomycin and gentamicin #4 application of a cell xenograft tissue graft. #5 local tissue rearrangement and closure of the wound #6 application of wound VAC.  SURGEON:  Surgeon(s): Nadara Mustard, MD  ANESTHESIA:   general  EBL:  Minimal ML  SPECIMEN:  No Specimen  TOURNIQUET:  * No tourniquets in log *  PROCEDURE DETAILS: Patient is a 36 year old gentleman history of tobacco use over 2 packs per day is status post 2 open reduction internal fixation surgeries for displaced calcaneus fracture. Patient presented with wound dehiscence necrotic tissue exposed bone and exposed hardware and presents at this time for urgent surgical intervention for her foot salvage surgery. Risks and benefits of surgery were discussed with the patient including infection neurovascular injury nonhealing of the wound potential for amputation. Patient states he understands and wished to proceed at this time. She'll procedure patient was brought to or room for and underwent a general LMA anesthetic. After adequate levels of anesthesia were obtained patient's right lower extremity was first scrubbed with Hibiclens dried and then prepped using DuraPrep and draped into a sterile field. His previous lateral incision was extended proximally and distally the necrotic wound was excised. The deep retained hardware was exposed and all deep retained hardware was removed. Partial calcaneal excision was performed both superiorly and medial lateral aspect of the bone was excised  down to bleeding viable granulation tissue. Antibiotic beads were made with the stimulant 10 cc with 500 mg of vancomycin and 240 mg of gentamicin. Antibiotic beads were placed deep within the wound. The Ace cell xenograft tissue graft was then used as a cover for the bone. The lateral flap was then rearranged to allow for local tissue coverage and the wound was closed using and held Cloward did not a suture technique. The wound was then covered with a wound VAC set to -75 mm of mercury. Patient was extubated taken the PACU in stable condition.  PLAN OF CARE: Admit to inpatient   PATIENT DISPOSITION:  PACU - hemodynamically stable.   Nadara Mustard, MD 07/26/2011 8:41 PM
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
## 2011-07-26 NOTE — Anesthesia Postprocedure Evaluation (Signed)
  Anesthesia Post-op Note  Patient: Anthony Santos  Procedure(s) Performed: Procedure(s) (LRB): HARDWARE REMOVAL (Right)  Patient Location: PACU  Anesthesia Type: General  Level of Consciousness: awake  Airway and Oxygen Therapy: Patient Spontanous Breathing  Post-op Pain: mild  Post-op Assessment: Post-op Vital signs reviewed, Patient's Cardiovascular Status Stable, Respiratory Function Stable and Pain level controlled  Post-op Vital Signs: stable  Complications: No apparent anesthesia complications

## 2011-07-26 NOTE — Transfer of Care (Signed)
Immediate Anesthesia Transfer of Care Note  Patient: Anthony Santos  Procedure(s) Performed: Procedure(s) (LRB): HARDWARE REMOVAL (Right)  Patient Location: PACU  Anesthesia Type: General  Level of Consciousness: awake  Airway & Oxygen Therapy: Patient Spontanous Breathing and Patient connected to nasal cannula oxygen  Post-op Assessment: Report given to PACU RN and Post -op Vital signs reviewed and stable  Post vital signs: Reviewed and stable  Complications: No apparent anesthesia complications

## 2011-07-27 MED ORDER — DIPHENHYDRAMINE HCL 25 MG PO CAPS
50.0000 mg | ORAL_CAPSULE | Freq: Three times a day (TID) | ORAL | Status: DC | PRN
  Administered 2011-07-27 – 2011-07-30 (×2): 50 mg via ORAL
  Filled 2011-07-27: qty 2
  Filled 2011-07-27: qty 1

## 2011-07-27 MED ORDER — WARFARIN SODIUM 10 MG PO TABS
10.0000 mg | ORAL_TABLET | Freq: Once | ORAL | Status: AC
  Administered 2011-07-27: 10 mg via ORAL
  Filled 2011-07-27: qty 1

## 2011-07-27 NOTE — Progress Notes (Signed)
Assessment:  35yom s/p ORIF revision of calcaneous fracture to begin coumadin for VTE prophylaxis. Baseline INR=0.99 and coumadin score=9. After initial dose of 10 mg  inr slight increase to 1.02 Goal of Therapy:  INR 2-3  Plan:  1) Coumadin 10mg  x 1  2) Daily INR
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
## 2011-07-27 NOTE — Plan of Care (Signed)
Problem: Diagnosis - Type of Surgery Goal: General Surgical Patient Education (See Patient Education module for education specifics) Outcome: Progressing Pt is aware of postop procedures. Adm. of antibiotics, antiemetics( diet progression explained), activity tolerance.   Problem: Consults Goal: Skin Care Protocol Initiated - if indicated If consults are not indicated, leave blank or document N/A Outcome: Progressing Pt. Is s/p ORIF reviison of R calcaneous fracture secondary to dehiscence of R heel wound. Currently wound vac in place and clean, dry, intact.Sanguinous drainage present in wound vac canister.Continuing to monitor.  Problem: Phase I Progression Outcomes Goal: Pain controlled with appropriate interventions Outcome: Progressing Meds given for pain. Goal: OOB as tolerated unless otherwise ordered Outcome: Not Met (add Reason) Resting at this time. Goal: Incision/dressings dry and intact Outcome: Progressing Wound vac dressing clean dry and intact Goal: Initial discharge plan identified Outcome: Progressing No definite discharge yet. Goal: Voiding-avoid urinary catheter unless indicated Outcome: Completed/Met Date Met:  07/27/11 Pt. voids per urinal. Goal: Vital signs/hemodynamically stable Outcome: Completed/Met Date Met:  07/27/11 Continuing to monitor.

## 2011-07-27 NOTE — Progress Notes (Signed)
Patient ID: Anthony Santos, male   DOB: 07/22/1975, 36 y.o.   MRN: [id number] Patient is postoperative day 1 for open wound and osteomyelitis status post ORIF calcaneus fracture. Patient underwent removal of deep retained hardware partial calcaneal excision placement of antibiotic beads placement of a cell xenograft and placement of a wound VAC. Patient will be hospitalized through Monday. Will reevaluate the wound on Monday. Patient may require discharge to home with a wound VAC.

## 2011-07-28 LAB — PROTIME-INR: INR: 1.44 (ref 0.00–1.49)

## 2011-07-28 MED ORDER — WARFARIN SODIUM 7.5 MG PO TABS
7.5000 mg | ORAL_TABLET | Freq: Once | ORAL | Status: AC
  Administered 2011-07-28: 7.5 mg via ORAL
  Filled 2011-07-28: qty 1

## 2011-07-28 NOTE — Progress Notes (Signed)
Subjective: 2 Days Post-Op Procedure(s) (LRB): HARDWARE REMOVAL (Right)  Patient reports pain as moderate.    Objective:   VITALS:  Temp:  [97.4 F (36.3 C)-98.2 F (36.8 C)] 97.4 F (36.3 C) (03/02 0629) Pulse Rate:  [65-90] 65  (03/02 0629) Resp:  [16-18] 18  (03/02 0629) BP: (99-112)/(54-59) 104/55 mmHg (03/02 0629) SpO2:  [95 %-100 %] 98 % (03/02 0629)  Neurologically intact ABD soft Neurovascular intact Sensation intact distally Intact pulses distally Dorsiflexion/Plantar flexion intact No cellulitis present Compartment soft VAC right lateral foot and heel intact and holding suction normally.   LABS  Basename 07/26/11 1529  HGB 16.0  WBC 12.4*  PLT 269    Basename 07/26/11 1529  NA 138  K 3.9  CL 103  CO2 25  BUN 10  CREATININE 0.93  GLUCOSE 97    Basename 07/28/11 0500 07/27/11 0545  LABPT -- --  INR 1.44 1.02     Assessment/Plan: 2 Days Post-Op Procedure(s) (LRB): HARDWARE REMOVAL (Right)  Advance diet Up with therapy Expected dressing change on Monday AM and  D/c planning at that time.   Rose-Marie Hickling E 07/28/2011, 12:32 PM

## 2011-07-28 NOTE — Progress Notes (Signed)
Assessment:  35yom s/p ORIF revision of calcaneous fracture to begin coumadin for VTE prophylaxis. Baseline INR=0.99 and coumadin score=9. After 2  doses of 10 mg   inr  increase to 1.91 Goal of Therapy:  INR 2-3  Plan:  1) Coumadin 7.5 x 1  2) Daily INR

## 2011-07-29 LAB — PROTIME-INR: Prothrombin Time: 20.3 seconds — ABNORMAL HIGH (ref 11.6–15.2)

## 2011-07-29 MED ORDER — METHOCARBAMOL 500 MG PO TABS
500.0000 mg | ORAL_TABLET | Freq: Three times a day (TID) | ORAL | Status: DC
  Administered 2011-07-29 (×2): 500 mg via ORAL
  Filled 2011-07-29 (×5): qty 1

## 2011-07-29 MED ORDER — WARFARIN SODIUM 7.5 MG PO TABS
7.5000 mg | ORAL_TABLET | Freq: Once | ORAL | Status: AC
  Administered 2011-07-29: 7.5 mg via ORAL
  Filled 2011-07-29: qty 1

## 2011-07-29 NOTE — Progress Notes (Signed)
Assessment:  35yom s/p ORIF revision of calcaneous fracture to begin coumadin for VTE prophylaxis. Baseline INR=0.99 and coumadin score=9. After 2  doses of 10 mg and  Then 7.5 mg   inr  increase to 1.70  Goal of Therapy:  INR 2-3  Plan:  1) Coumadin 7.5 x 1  2) Daily INR

## 2011-07-29 NOTE — Progress Notes (Signed)
Subjective: 3 Days Post-Op Procedure(s) (LRB): HARDWARE REMOVAL (Right) Awake alert tolerating by mouth medication and diet. Voiding without difficulty. Patient reports pain as mild.    Objective:   VITALS:  Temp:  [97.8 F (36.6 C)-98.2 F (36.8 C)] 97.8 F (36.6 C) (03/03 0537) Pulse Rate:  [77-86] 86  (03/03 0537) Resp:  [18] 18  (03/03 0537) BP: (117-123)/(69-73) 121/69 mmHg (03/03 0537) SpO2:  [96 %-100 %] 96 % (03/03 0537)  Neurologically intact Neurovascular intact Sensation intact distally Intact pulses distally Dorsiflexion/Plantar flexion intact VAC remains in place of the right lateral foot and heel with very little drainage. Plan is for this to be changed tomorrow and further plans made at that point. Dr. Lajoyce Corners to return tomorrow.   LABS  Basename 07/26/11 1529  HGB 16.0  WBC 12.4*  PLT 269    Basename 07/26/11 1529  NA 138  K 3.9  CL 103  CO2 25  BUN 10  CREATININE 0.93  GLUCOSE 97    Basename 07/29/11 0610 07/28/11 0500  LABPT -- --  INR 1.70* 1.44     Assessment/Plan: 3 Days Post-Op Procedure(s) (LRB): HARDWARE REMOVAL (Right)  Advance diet Up with therapy D/C IV fluids VAC changed tomorrow.   Annalyse Langlais E 07/29/2011, 10:07 AM

## 2011-07-30 LAB — PROTIME-INR
INR: 1.91 — ABNORMAL HIGH (ref 0.00–1.49)
Prothrombin Time: 22.2 seconds — ABNORMAL HIGH (ref 11.6–15.2)

## 2011-07-30 NOTE — Discharge Summary (Signed)
Physician Discharge Summary  Patient ID: Anthony Santos MRN: [id number] DOB/AGE: [birth year] 36 y.o.  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  Discharge Diagnoses: Same Active Problems:  * No active hospital problems. *    Discharged Condition: stable  Hospital Course: Patient's hospital course was essentially unremarkable he underwent removal of deep hardware partial excision of the calcaneus placement of antibiotic beads placement of a cell xenograft rotation flap with wound coverage and placement of a wound VAC. Postoperatively patient progressed well the wound edges reapproximated and he was discharged to home in stable condition.  Consults: None  Significant Diagnostic Studies: labs: Routine labs  Treatments: surgery: Please see the operative note.  Discharge Exam: Blood pressure 120/80, pulse 72, temperature 98.6 F (37 C), temperature source Oral, resp. rate 20, height 6\' 2"  (1.88 m), weight 88.451 kg (195 lb), SpO2 98.00%. Incision/Wound: incision clean and dry at time of discharge no ischemic changes no open wound.  Disposition: 01-Home or Self Care  Discharge Orders    Future Orders Please Complete By Expires   Diet - low sodium heart healthy      Call MD / Call 911      Comments:   If you experience chest pain or shortness of breath, CALL 911 and be transported to the hospital emergency room.  If you develope a fever above 101 F, pus (white drainage) or increased drainage or redness at the wound, or calf pain, call your surgeon's office.   Constipation Prevention      Comments:   Drink plenty of fluids.  Prune juice may be helpful.  You may use a stool softener, such as Colace (over the counter) 100 mg twice a day.  Use MiraLax (over the counter) for constipation as needed.   Increase activity slowly as tolerated      Weight Bearing as taught in  Physical Therapy      Comments:   Use a walker or crutches as instructed.     Medication List  As of 07/30/2011  6:32 AM   TAKE these medications         acetaminophen 500 MG tablet   Commonly known as: TYLENOL   Take 500 mg by mouth every 6 (six) hours as needed. For pain      bismuth subsalicylate 262 MG/15ML suspension   Commonly known as: PEPTO BISMOL   Take 15 mLs by mouth every 6 (six) hours as needed. Stomach distress      HYDROcodone-acetaminophen 5-325 MG per tablet   Commonly known as: NORCO   Take 1 tablet by mouth every 6 (six) hours as needed. For pain           Follow-up Information    Follow up with Xzayvier Fagin V, MD in 4 days.   Contact information:   [address] [place] [address] [phone]          Signed: Nadara Mustard 07/30/2011, 6:32 AM

## 2011-07-30 NOTE — Progress Notes (Signed)
CARE MANAGEMENT NOTE 07/30/2011  Patient:  Anthony Santos, Anthony Santos   Account Number:  [id number]  Date Initiated:  07/30/2011  Documentation initiated by:  Vance Peper  Subjective/Objective Assessment:   36 yr old ,male s/p ORIF right heel     Action/Plan:   Discharge planning. No HH needs.   Anticipated DC Date:  07/30/2011   Anticipated DC Plan:  HOME/SELF CARE      DC Planning Services  CM consult      PAC Choice  NA   Choice offered to / List presented to:  NA   DME arranged  NA      DME agency  NA     HH arranged  NA      HH agency  NA   Status of service:  Completed, signed off Medicare Important Message given?   (If response is "NO", the following Medicare IM given date fields will be blank) Date Medicare IM given:   Date Additional Medicare IM given:    Discharge Disposition:  HOME/SELF CARE  Per UR Regulation:    Comments:

## 2011-07-30 NOTE — Progress Notes (Signed)
Gave pt D/C instructions. Pt being D/Cd to home.

## 2011-08-01 ENCOUNTER — Encounter (HOSPITAL_COMMUNITY): Payer: Self-pay | Admitting: Orthopedic Surgery

## 2011-11-29 ENCOUNTER — Emergency Department: Payer: Self-pay | Admitting: Emergency Medicine

## 2014-08-09 ENCOUNTER — Emergency Department: Payer: Self-pay | Admitting: Emergency Medicine

## 2015-11-16 ENCOUNTER — Emergency Department: Payer: Managed Care, Other (non HMO)

## 2015-11-16 ENCOUNTER — Encounter: Payer: Self-pay | Admitting: Emergency Medicine

## 2015-11-16 ENCOUNTER — Emergency Department
Admission: EM | Admit: 2015-11-16 | Discharge: 2015-11-16 | Disposition: A | Discharge status: Home / Self Care | Payer: Managed Care, Other (non HMO) | Attending: Emergency Medicine | Admitting: Emergency Medicine

## 2015-11-16 DIAGNOSIS — Z79899 Other long term (current) drug therapy: Secondary | ICD-10-CM | POA: Diagnosis not present

## 2015-11-16 DIAGNOSIS — Z87891 Personal history of nicotine dependence: Secondary | ICD-10-CM | POA: Diagnosis not present

## 2015-11-16 DIAGNOSIS — R109 Unspecified abdominal pain: Secondary | ICD-10-CM

## 2015-11-16 DIAGNOSIS — K279 Peptic ulcer, site unspecified, unspecified as acute or chronic, without hemorrhage or perforation: Secondary | ICD-10-CM | POA: Insufficient documentation

## 2015-11-16 DIAGNOSIS — R1010 Upper abdominal pain, unspecified: Secondary | ICD-10-CM | POA: Diagnosis present

## 2015-11-16 LAB — URINALYSIS COMPLETE WITH MICROSCOPIC (ARMC ONLY)
BILIRUBIN URINE: NEGATIVE
Bacteria, UA: NONE SEEN
GLUCOSE, UA: NEGATIVE mg/dL
HGB URINE DIPSTICK: NEGATIVE
Ketones, ur: NEGATIVE mg/dL
LEUKOCYTES UA: NEGATIVE
NITRITE: NEGATIVE
Protein, ur: NEGATIVE mg/dL
SPECIFIC GRAVITY, URINE: 1.014 (ref 1.005–1.030)
pH: 5 (ref 5.0–8.0)

## 2015-11-16 LAB — COMPREHENSIVE METABOLIC PANEL
ALK PHOS: 68 U/L (ref 38–126)
ALT: 18 U/L (ref 17–63)
AST: 20 U/L (ref 15–41)
Albumin: 4.5 g/dL (ref 3.5–5.0)
Anion gap: 8 (ref 5–15)
BILIRUBIN TOTAL: 0.7 mg/dL (ref 0.3–1.2)
BUN: 9 mg/dL (ref 6–20)
CALCIUM: 9.2 mg/dL (ref 8.9–10.3)
CO2: 21 mmol/L — ABNORMAL LOW (ref 22–32)
CREATININE: 0.78 mg/dL (ref 0.61–1.24)
Chloride: 110 mmol/L (ref 101–111)
GFR calc Af Amer: >60 mL/min (ref >60)
Glucose, Bld: 92 mg/dL (ref 65–99)
Potassium: 4.4 mmol/L (ref 3.5–5.1)
Sodium: 139 mmol/L (ref 135–145)
TOTAL PROTEIN: 7.2 g/dL (ref 6.5–8.1)

## 2015-11-16 LAB — CBC
HCT: 46.8 % (ref 40.0–52.0)
Hemoglobin: 16.5 g/dL (ref 13.0–18.0)
MCH: 30.7 pg (ref 26.0–34.0)
MCHC: 35.3 g/dL (ref 32.0–36.0)
MCV: 87.2 fL (ref 80.0–100.0)
PLATELETS: 283 10*3/uL (ref 150–440)
RBC: 5.37 MIL/uL (ref 4.40–5.90)
RDW: 13 % (ref 11.5–14.5)
WBC: 12.3 10*3/uL — AB (ref 3.8–10.6)

## 2015-11-16 LAB — LIPASE, BLOOD: Lipase: 26 U/L (ref 11–51)

## 2015-11-16 MED ORDER — DIATRIZOATE MEGLUMINE & SODIUM 66-10 % PO SOLN
15.0000 mL | Freq: Once | ORAL | Status: AC
  Administered 2015-11-16: 15 mL via ORAL

## 2015-11-16 MED ORDER — TRAMADOL HCL 50 MG PO TABS: 50.0000 mg | ORAL_TABLET | Freq: Four times a day (QID) | ORAL | Status: AC | PRN

## 2015-11-16 MED ORDER — TRAMADOL HCL 50 MG PO TABS
50.0000 mg | ORAL_TABLET | Freq: Once | ORAL | Status: AC
  Administered 2015-11-16: 50 mg via ORAL
  Filled 2015-11-16: qty 1

## 2015-11-16 MED ORDER — FAMOTIDINE 20 MG PO TABS
40.0000 mg | ORAL_TABLET | Freq: Once | ORAL | Status: AC
  Administered 2015-11-16: 40 mg via ORAL
  Filled 2015-11-16: qty 2

## 2015-11-16 MED ORDER — LANSOPRAZOLE 30 MG PO TBDP: 30.0000 mg | ORAL_TABLET | Freq: Every day | ORAL | Status: DC

## 2015-11-16 MED ORDER — IOPAMIDOL (ISOVUE-300) INJECTION 61%
100.0000 mL | Freq: Once | INTRAVENOUS | Status: AC | PRN
  Administered 2015-11-16: 100 mL via INTRAVENOUS
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
## 2015-11-16 NOTE — ED Notes (Signed)
[facility] abd pain and diarrhea since Sunday.

## 2016-05-01 ENCOUNTER — Ambulatory Visit: Payer: Managed Care, Other (non HMO) | Admitting: Anesthesiology

## 2016-05-01 ENCOUNTER — Ambulatory Visit
Admission: RE | Admit: 2016-05-01 | Discharge: 2016-05-01 | Disposition: A | Discharge status: Home / Self Care | Payer: Managed Care, Other (non HMO) | Source: Ambulatory Visit | Attending: Gastroenterology | Admitting: Gastroenterology

## 2016-05-01 ENCOUNTER — Encounter
Admission: RE | Disposition: A | Discharge status: Home / Self Care | Payer: Self-pay | Source: Ambulatory Visit | Attending: Gastroenterology

## 2016-05-01 DIAGNOSIS — K9289 Other specified diseases of the digestive system: Secondary | ICD-10-CM | POA: Insufficient documentation

## 2016-05-01 DIAGNOSIS — K529 Noninfective gastroenteritis and colitis, unspecified: Secondary | ICD-10-CM | POA: Insufficient documentation

## 2016-05-01 DIAGNOSIS — R1030 Lower abdominal pain, unspecified: Secondary | ICD-10-CM | POA: Diagnosis not present

## 2016-05-01 DIAGNOSIS — K6289 Other specified diseases of anus and rectum: Secondary | ICD-10-CM | POA: Insufficient documentation

## 2016-05-01 DIAGNOSIS — K635 Polyp of colon: Secondary | ICD-10-CM | POA: Insufficient documentation

## 2016-05-01 DIAGNOSIS — Z87891 Personal history of nicotine dependence: Secondary | ICD-10-CM | POA: Insufficient documentation

## 2016-05-01 HISTORY — PX: COLONOSCOPY WITH PROPOFOL: SHX5780

## 2016-05-01 SURGERY — COLONOSCOPY WITH PROPOFOL
Anesthesia: General

## 2016-05-01 MED ORDER — PROPOFOL 10 MG/ML IV BOLUS
INTRAVENOUS | Status: DC | PRN
  Administered 2016-05-01: 70 mg via INTRAVENOUS

## 2016-05-01 MED ORDER — SODIUM CHLORIDE 0.9 % IV SOLN
INTRAVENOUS | Status: DC
  Administered 2016-05-01: 1000 mL via INTRAVENOUS

## 2016-05-01 MED ORDER — MIDAZOLAM HCL 2 MG/2ML IJ SOLN
INTRAMUSCULAR | Status: DC | PRN
  Administered 2016-05-01: 1 mg via INTRAVENOUS

## 2016-05-01 MED ORDER — SODIUM CHLORIDE 0.9 % IV SOLN: INTRAVENOUS | Status: DC

## 2016-05-01 MED ORDER — PROPOFOL 500 MG/50ML IV EMUL
INTRAVENOUS | Status: DC | PRN
  Administered 2016-05-01: 150 ug/kg/min via INTRAVENOUS

## 2016-05-01 MED ORDER — PHENYLEPHRINE HCL 10 MG/ML IJ SOLN
INTRAMUSCULAR | Status: DC | PRN
  Administered 2016-05-01: 50 ug via INTRAVENOUS
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
## 2016-05-01 NOTE — Anesthesia Postprocedure Evaluation (Signed)
Anesthesia Post Note  Patient: Anthony Santos  Procedure(s) Performed: Procedure(s) (LRB): COLONOSCOPY WITH PROPOFOL (N/A)  Patient location during evaluation: Endoscopy Anesthesia Type: General Level of consciousness: awake and alert and oriented Pain management: pain level controlled Vital Signs Assessment: post-procedure vital signs reviewed and stable Respiratory status: spontaneous breathing, nonlabored ventilation and respiratory function stable Cardiovascular status: blood pressure returned to baseline and stable Postop Assessment: no signs of nausea or vomiting Anesthetic complications: no    Last Vitals:  Vitals:   05/01/16 1406 05/01/16 1415  BP: 109/86 100/67  Pulse: 68 (!) 56  Resp: 15 13  Temp:      Last Pain:  Vitals:   05/01/16 1405  TempSrc: Tympanic  PainSc:                  Kaari Zeigler

## 2016-05-01 NOTE — Transfer of Care (Signed)
Immediate Anesthesia Transfer of Care Note  Patient: Anthony Santos  Procedure(s) Performed: Procedure(s): COLONOSCOPY WITH PROPOFOL (N/A)  Patient Location: PACU  Anesthesia Type:General  Level of Consciousness: sedated  Airway & Oxygen Therapy: Patient Spontanous Breathing and Patient connected to nasal cannula oxygen  Post-op Assessment: Report given to RN and Post -op Vital signs reviewed and stable  Post vital signs: Reviewed and stable  Last Vitals:  Vitals:   05/01/16 1404 05/01/16 1405  BP: 109/86 (!) 78/41  Pulse: 64 66  Resp: 15 14  Temp: 36.2 C 36.2 C    Last Pain:  Vitals:   05/01/16 1405  TempSrc: Tympanic  PainSc:          Complications: No apparent anesthesia complications

## 2016-05-01 NOTE — Op Note (Signed)
[name] Procedure Date: 05/01/2016 1:24 PM MRN: [id number] Account #: [id number] Date of Birth: June 20, 1975 Admit Type: Outpatient Age: 40 Room: [facility] ENDO ROOM 3 Gender: Male Note Status: Finalized Procedure:            Colonoscopy Indications:          Lower abdominal pain, Chronic diarrhea Providers:            Christena Deem, MD Referring MD:         No Local Md, MD (Referring MD) Medicines:            Monitored Anesthesia Care Complications:        No immediate complications. Procedure:            Pre-Anesthesia Assessment:                       - ASA Grade Assessment: I - A normal, healthy patient.                       After obtaining informed consent, the colonoscope was                        passed under direct vision. Throughout the procedure,                        the patient's blood pressure, pulse, and oxygen                        saturations were monitored continuously. The                        Colonoscope was introduced through the anus and                        advanced to the the terminal ileum. The colonoscopy was                        performed without difficulty. The patient tolerated the                        procedure well. The quality of the bowel preparation                        was fair. Findings:      Three sessile polyps were found in the sigmoid colon. The polyps were 1       to 2 mm in size. These polyps were removed with a cold biopsy forceps.       Resection and retrieval were complete.      A less than 1 mm polyp was found in the ileocecal valve. The polyp was       sessile. The polyp was removed with a cold biopsy forceps. Resection and       retrieval were complete.      Biopsies for histology were taken with a cold forceps from the right       colon and left colon for evaluation of microscopic colitis.      The exam was otherwise without abnormality. Impression:            - Preparation of the colon was fair.                       -  Three 1 to 2 mm polyps in the sigmoid colon, removed                        with a cold biopsy forceps. Resected and retrieved.                       - One less than 1 mm polyp at the ileocecal valve,                        removed with a cold biopsy forceps. Resected and                        retrieved.                       - The examination was otherwise normal.                       - Biopsies were taken with a cold forceps from the                        right colon and left colon for evaluation of                        microscopic colitis. Recommendation:       - Discharge patient to home.                       - Use Citrucel one tablespoon PO daily.                       - Await pathology results.                       - Telephone GI clinic for pathology results in 1 week. Procedure Code(s):    --- Professional ---                       [phone], Colonoscopy, flexible; with biopsy, single or                        multiple Diagnosis Code(s):    --- Professional ---                       D12.5, Benign neoplasm of sigmoid colon                       D12.0, Benign neoplasm of cecum                       R10.30, Lower abdominal pain, unspecified                       K52.9, Noninfective gastroenteritis and colitis,                        unspecified CPT copyright 2016 American Medical Association. All rights reserved. The codes documented in this report are preliminary and upon coder review may  be revised to meet current compliance requirements. Christena DeemMartin U Maliik Karner, MD 05/01/2016 2:04:20 PM This report has been signed electronically. Number of Addenda: 0 Note Initiated On: 05/01/2016 1:24 PM  Scope Withdrawal Time: 0 hours 18 minutes 34 seconds  Total Procedure Duration: 0 hours 30 minutes 10 seconds       [facility]
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
## 2016-05-02 ENCOUNTER — Encounter: Payer: Self-pay | Admitting: Gastroenterology

## 2016-05-03 LAB — SURGICAL PATHOLOGY

## 2016-06-18 ENCOUNTER — Other Ambulatory Visit
Admission: RE | Admit: 2016-06-18 | Discharge: 2016-06-18 | Disposition: A | Discharge status: Home / Self Care | Payer: Managed Care, Other (non HMO) | Source: Ambulatory Visit | Attending: Gastroenterology | Admitting: Gastroenterology

## 2016-06-18 DIAGNOSIS — R1030 Lower abdominal pain, unspecified: Secondary | ICD-10-CM | POA: Diagnosis not present

## 2016-06-18 DIAGNOSIS — R933 Abnormal findings on diagnostic imaging of other parts of digestive tract: Secondary | ICD-10-CM | POA: Insufficient documentation

## 2016-06-18 DIAGNOSIS — R197 Diarrhea, unspecified: Secondary | ICD-10-CM | POA: Insufficient documentation

## 2016-06-22 LAB — MISCELLANEOUS TEST

## 2016-11-26 ENCOUNTER — Ambulatory Visit (INDEPENDENT_AMBULATORY_CARE_PROVIDER_SITE_OTHER): Payer: Self-pay | Admitting: Orthopedic Surgery

## 2016-12-20 ENCOUNTER — Ambulatory Visit (INDEPENDENT_AMBULATORY_CARE_PROVIDER_SITE_OTHER): Payer: Managed Care, Other (non HMO) | Admitting: Orthopedic Surgery

## 2017-01-22 ENCOUNTER — Other Ambulatory Visit: Payer: Self-pay | Admitting: Orthopedic Surgery

## 2017-01-22 DIAGNOSIS — M7041 Prepatellar bursitis, right knee: Secondary | ICD-10-CM

## 2017-01-31 ENCOUNTER — Ambulatory Visit
Admission: RE | Admit: 2017-01-31 | Discharge: 2017-01-31 | Disposition: A | Discharge status: Home / Self Care | Payer: Managed Care, Other (non HMO) | Source: Ambulatory Visit | Attending: Orthopedic Surgery | Admitting: Orthopedic Surgery

## 2017-01-31 DIAGNOSIS — M7041 Prepatellar bursitis, right knee: Secondary | ICD-10-CM

## 2017-01-31 MED ORDER — METHYLPREDNISOLONE ACETATE 40 MG/ML INJ SUSP (RADIOLOG
120.0000 mg | Freq: Once | INTRAMUSCULAR | Status: AC
  Administered 2017-01-31: 120 mg via INTRA_ARTICULAR

## 2017-01-31 MED ORDER — IOPAMIDOL (ISOVUE-M 200) INJECTION 41%
1.0000 mL | Freq: Once | INTRAMUSCULAR | Status: AC
  Administered 2017-01-31: 1 mL via INTRA_ARTICULAR

## 2017-05-28 HISTORY — PX: SHOULDER ARTHROSCOPY: SHX128

## 2019-04-14 ENCOUNTER — Emergency Department
Admission: EM | Admit: 2019-04-14 | Discharge: 2019-04-14 | Disposition: A | Discharge status: Home / Self Care | Payer: 59 | Attending: Emergency Medicine | Admitting: Emergency Medicine

## 2019-04-14 ENCOUNTER — Encounter: Payer: Self-pay | Admitting: Emergency Medicine

## 2019-04-14 ENCOUNTER — Other Ambulatory Visit: Payer: Self-pay

## 2019-04-14 ENCOUNTER — Emergency Department: Payer: 59

## 2019-04-14 DIAGNOSIS — Z79899 Other long term (current) drug therapy: Secondary | ICD-10-CM | POA: Diagnosis not present

## 2019-04-14 DIAGNOSIS — R1011 Right upper quadrant pain: Secondary | ICD-10-CM | POA: Diagnosis present

## 2019-04-14 DIAGNOSIS — Z87891 Personal history of nicotine dependence: Secondary | ICD-10-CM | POA: Diagnosis not present

## 2019-04-14 DIAGNOSIS — R109 Unspecified abdominal pain: Secondary | ICD-10-CM

## 2019-04-14 LAB — CBC
HCT: 47.1 % (ref 39.0–52.0)
Hemoglobin: 16.2 g/dL (ref 13.0–17.0)
MCH: 30 pg (ref 26.0–34.0)
MCHC: 34.4 g/dL (ref 30.0–36.0)
MCV: 87.2 fL (ref 80.0–100.0)
Platelets: 307 10*3/uL (ref 150–400)
RBC: 5.4 MIL/uL (ref 4.22–5.81)
RDW: 12.1 % (ref 11.5–15.5)
WBC: 12.2 10*3/uL — ABNORMAL HIGH (ref 4.0–10.5)
nRBC: 0 % (ref 0.0–0.2)

## 2019-04-14 LAB — COMPREHENSIVE METABOLIC PANEL
ALT: 25 U/L (ref 0–44)
AST: 20 U/L (ref 15–41)
Albumin: 4.4 g/dL (ref 3.5–5.0)
Alkaline Phosphatase: 68 U/L (ref 38–126)
Anion gap: 8 (ref 5–15)
BUN: 12 mg/dL (ref 6–20)
CO2: 24 mmol/L (ref 22–32)
Calcium: 9.1 mg/dL (ref 8.9–10.3)
Chloride: 106 mmol/L (ref 98–111)
Creatinine, Ser: 0.78 mg/dL (ref 0.61–1.24)
GFR calc Af Amer: >60 mL/min (ref >60)
GFR calc non Af Amer: >60 mL/min (ref >60)
Glucose, Bld: 117 mg/dL — ABNORMAL HIGH (ref 70–99)
Potassium: 4 mmol/L (ref 3.5–5.1)
Sodium: 138 mmol/L (ref 135–145)
Total Bilirubin: 0.7 mg/dL (ref 0.3–1.2)
Total Protein: 7.7 g/dL (ref 6.5–8.1)

## 2019-04-14 LAB — URINALYSIS, COMPLETE (UACMP) WITH MICROSCOPIC
Bacteria, UA: NONE SEEN
Bilirubin Urine: NEGATIVE
Glucose, UA: NEGATIVE mg/dL
Hgb urine dipstick: NEGATIVE
Ketones, ur: NEGATIVE mg/dL
Leukocytes,Ua: NEGATIVE
Nitrite: NEGATIVE
Protein, ur: NEGATIVE mg/dL
Specific Gravity, Urine: 1.008 (ref 1.005–1.030)
Squamous Epithelial / LPF: NONE SEEN (ref 0–5)
WBC, UA: NONE SEEN WBC/hpf (ref 0–5)
pH: 6 (ref 5.0–8.0)

## 2019-04-14 LAB — LIPASE, BLOOD: Lipase: 51 U/L (ref 11–51)

## 2019-04-14 MED ORDER — HYDROCODONE-ACETAMINOPHEN 5-325 MG PO TABS: 1.0000 | ORAL_TABLET | ORAL | 0 refills | Status: DC | PRN

## 2019-04-14 MED ORDER — KETOROLAC TROMETHAMINE 60 MG/2ML IM SOLN
60.0000 mg | Freq: Once | INTRAMUSCULAR | Status: AC
  Administered 2019-04-14: 60 mg via INTRAMUSCULAR
  Filled 2019-04-14: qty 2

## 2019-04-14 MED ORDER — OXYCODONE-ACETAMINOPHEN 5-325 MG PO TABS
1.0000 | ORAL_TABLET | ORAL | Status: DC | PRN
  Administered 2019-04-14: 1 via ORAL
  Filled 2019-04-14: qty 1

## 2019-04-14 MED ORDER — DICYCLOMINE HCL 20 MG PO TABS: 20.0000 mg | ORAL_TABLET | Freq: Three times a day (TID) | ORAL | 0 refills | Status: DC | PRN

## 2019-04-14 NOTE — ED Notes (Signed)
Escorted from [facility] via wheelchair with right side flank pain

## 2019-04-14 NOTE — ED Triage Notes (Signed)
PT c/o RLQ/RT flank pain x1wk. Pt sent from [facility] for further eval. Denies any GU symptoms. Denies n/v/d. NAD noted
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
## 2020-01-15 IMAGING — CT CT RENAL STONE PROTOCOL
3 of 4 series · 9 of 46 positions shown, 14 images · non-contrast
Comparison: 11/16/2015

CLINICAL DATA: Right flank pain over the last week. Renal stone
disease suspected.

EXAM:
CT ABDOMEN AND PELVIS WITHOUT CONTRAST
TECHNIQUE: Multidetector CT imaging of the abdomen and pelvis was performed
following the standard protocol without IV contrast.

[Series 4: lung bases · axial · 0.73mm/px · z∈[-443,-343]mm · 5 of 30 slices shown, 10 images]
[im 5/30  soft-tissue]
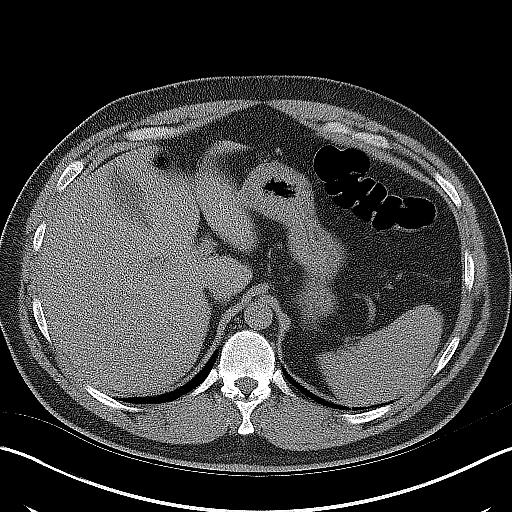
[im 5/30  bone]
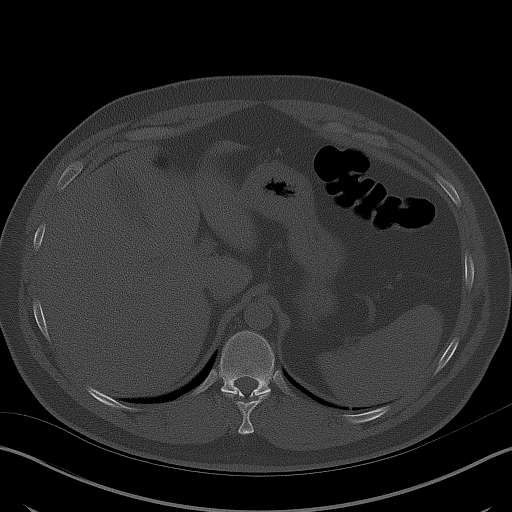
[im 10/30  soft-tissue]
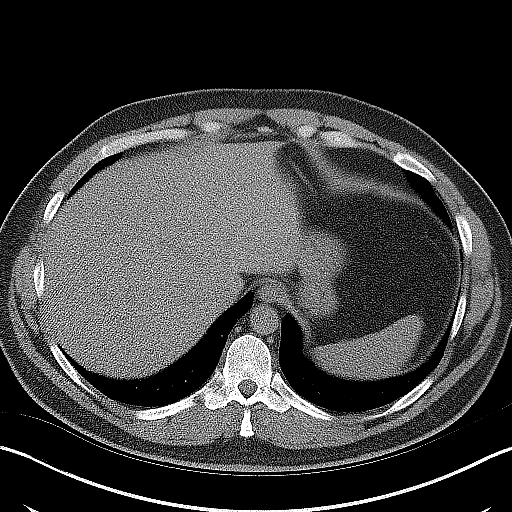
[im 10/30  lung]
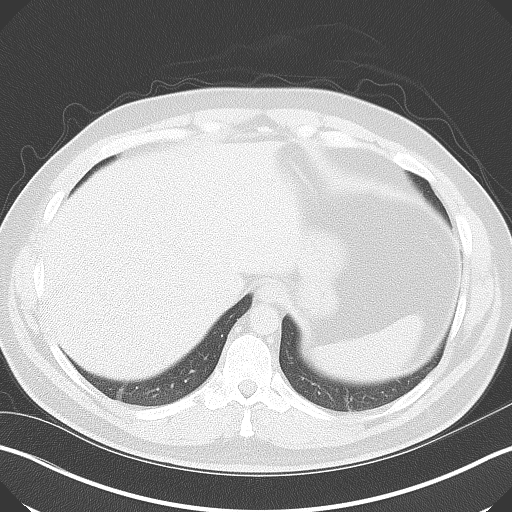
[im 15/30  soft-tissue]
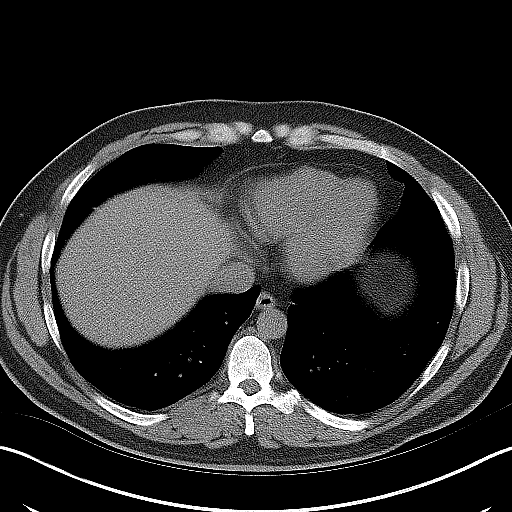
[im 15/30  lung]
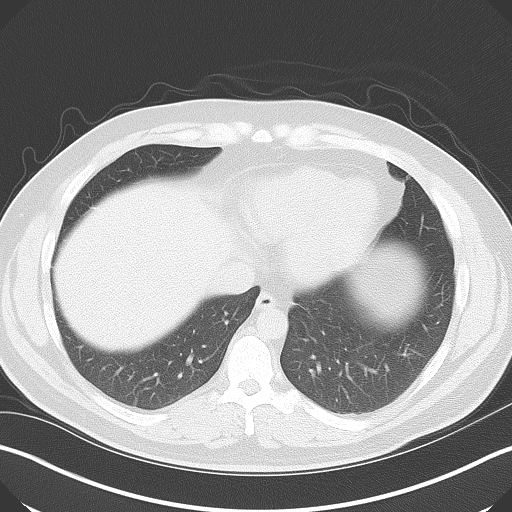
[im 20/30  soft-tissue]
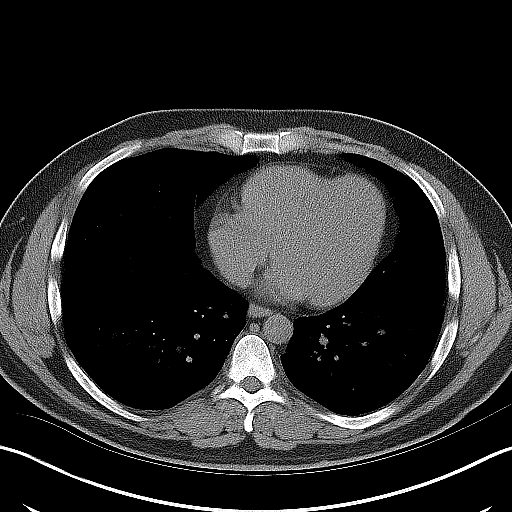
[im 20/30  lung]
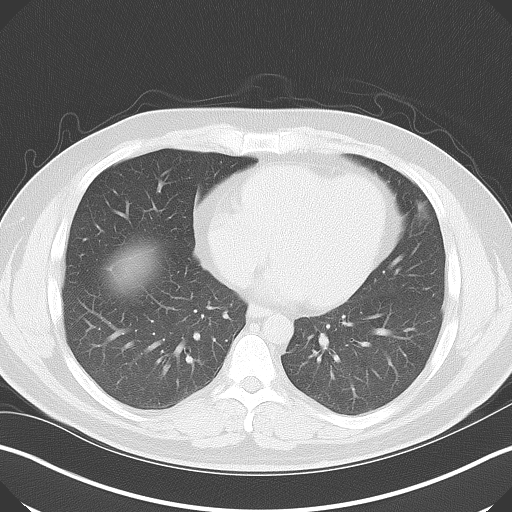
[im 25/30  soft-tissue]
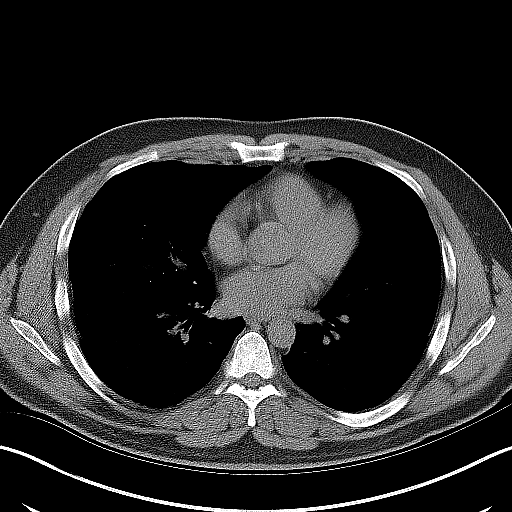
[im 25/30  lung]
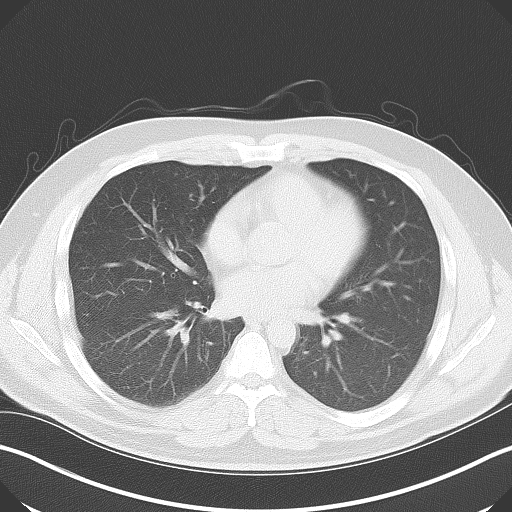

[Series 5: sagittal · sagittal · 0.58mm/px · 1 of 176 slices shown]
[im 59/176  soft-tissue]
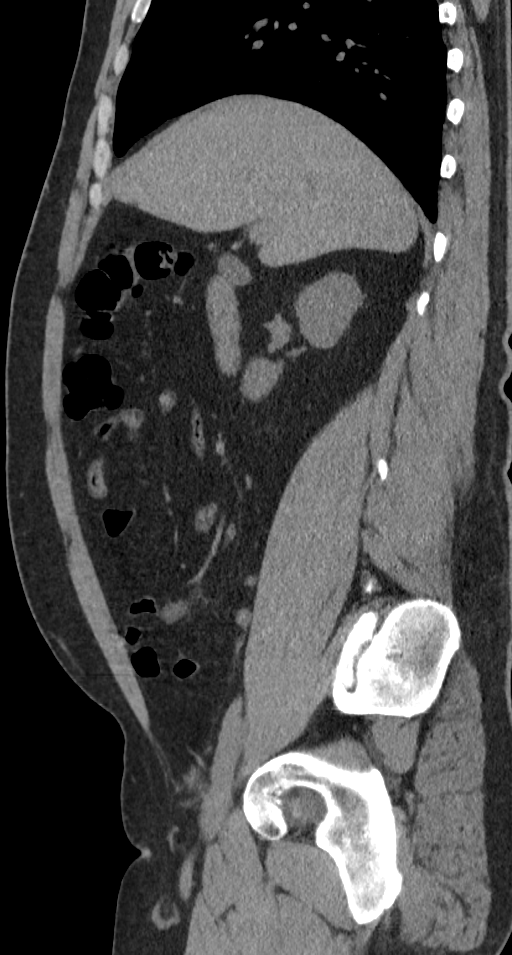

[Series 6: coronal · coronal · 0.68mm/px · 3 of 148 slices shown]
[im 50/148  soft-tissue]
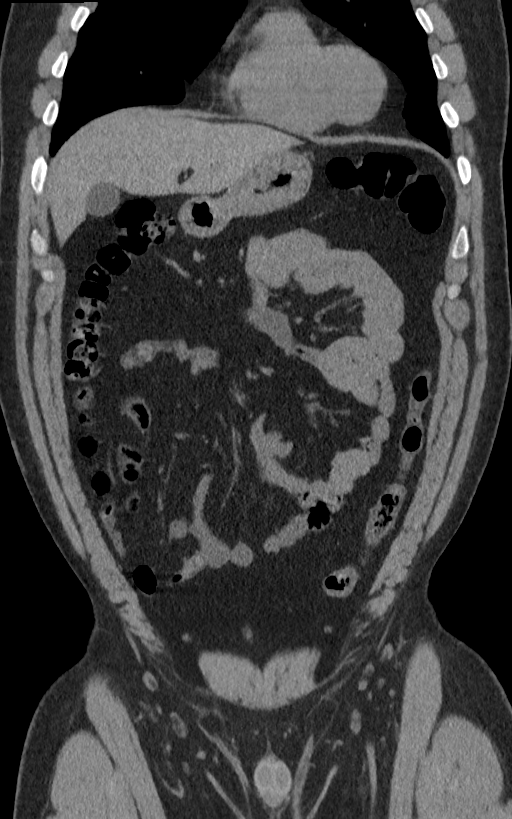
[im 66/148  soft-tissue]
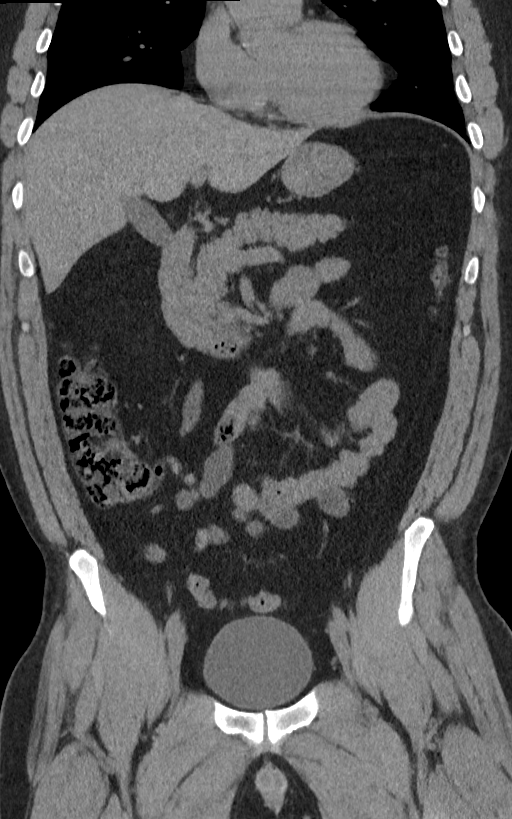
[im 82/148  soft-tissue]
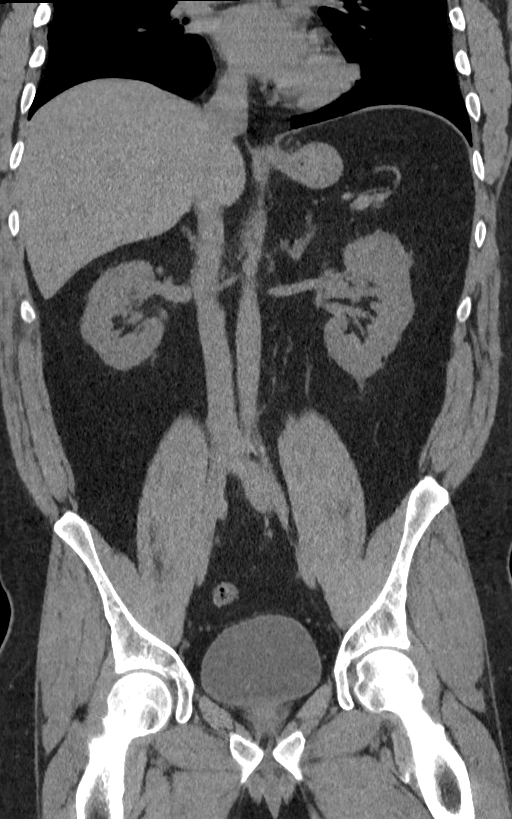

[9 of 46 positions shown; findings below may reference images not displayed]

FINDINGS: Lower chest: Normal

Hepatobiliary: Normal

Pancreas: Normal

Spleen: Normal

Adrenals/Urinary Tract: Adrenal glands are normal. Kidneys are
normal. No evidence of stone disease. No hydronephrosis. Bladder is
normal.

Stomach/Bowel: No bowel pathology is seen. Normal appearing
appendix.

Vascular/Lymphatic: Minimal aortic atherosclerosis. No aneurysm. IVC
is normal. No retroperitoneal adenopathy.

Reproductive: Normal

Other: No free fluid or air.

Musculoskeletal: Normal
IMPRESSION: Negative CT scan. No abnormality seen to explain the presenting
symptoms. No evidence of urinary tract pathology. Specifically, no
evidence of urinary tract stone disease on the right. Normal
appearing appendix. No other abnormality seen to explain right flank
pain.

## 2021-05-11 ENCOUNTER — Other Ambulatory Visit: Payer: Self-pay

## 2021-05-11 ENCOUNTER — Ambulatory Visit (INDEPENDENT_AMBULATORY_CARE_PROVIDER_SITE_OTHER): Payer: 59 | Admitting: Orthopedic Surgery

## 2021-05-11 ENCOUNTER — Ambulatory Visit (INDEPENDENT_AMBULATORY_CARE_PROVIDER_SITE_OTHER): Payer: Managed Care, Other (non HMO)

## 2021-05-11 DIAGNOSIS — M25571 Pain in right ankle and joints of right foot: Secondary | ICD-10-CM

## 2021-05-11 DIAGNOSIS — S92041S Displaced other fracture of tuberosity of right calcaneus, sequela: Secondary | ICD-10-CM

## 2021-05-11 DIAGNOSIS — M25561 Pain in right knee: Secondary | ICD-10-CM

## 2021-05-16 ENCOUNTER — Encounter: Payer: Self-pay | Admitting: Orthopedic Surgery
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
## 2021-06-01 ENCOUNTER — Telehealth: Payer: Self-pay | Admitting: Orthopedic Surgery

## 2021-06-01 NOTE — Telephone Encounter (Signed)
Note:  Received $25.00 cash,medical records release form and FMLA paperwork from patient    Forwarding to [employer] today Please fax to HR    Attn: Glendale Chard    Fax# [phone]

## 2021-06-09 ENCOUNTER — Telehealth: Payer: Self-pay | Admitting: Orthopedic Surgery

## 2021-06-09 NOTE — Telephone Encounter (Signed)
Completed FMLA form faxed to [facility] [phone]. To [employer] to log and scan in.

## 2021-06-13 NOTE — Pre-Procedure Instructions (Signed)
Surgical Instructions    Your procedure is scheduled on Friday 06/16/21.   Report to [facility] Main Entrance "A" at 05:30 A.M., then check in with the Admitting office.  Call this number if you have problems the morning of surgery:  [phone]   If you have any questions prior to your surgery date call [phone]: Open Monday-Friday 8am-4pm    Remember:  Do not eat after midnight the night before your surgery  You may drink clear liquids until 04:30 A.M. the morning of your surgery.   Clear liquids allowed are: Water, Non-Citrus Juices (without pulp), Carbonated Beverages, Clear Tea, Black Coffee ONLY (NO MILK, CREAM OR POWDERED CREAMER of any kind), and Gatorade   Enhanced Recovery after Surgery for Orthopedics Enhanced Recovery after Surgery is a protocol used to improve the stress on your body and your recovery after surgery.  Patient Instructions  The day of surgery (if you do NOT have diabetes):  Drink ONE (1) Pre-Surgery Clear Ensure by 04:30 am the morning of surgery   This drink was given to you during your hospital  pre-op appointment visit. Nothing else to drink after completing the  Pre-Surgery Clear Ensure.         If you have questions, please contact your surgeons office.              Take these medicines the morning of surgery with A SIP OF WATER   NONE   As of today, STOP taking any Aspirin (unless otherwise instructed by your surgeon) Aleve, Naproxen, Ibuprofen, Motrin, Advil, Goody's, BC's, all herbal medications, fish oil, and all vitamins.     After your COVID test   You are not required to quarantine however you are required to wear a well-fitting mask when you are out and around people not in your household.  If your mask becomes wet or soiled, replace with a new one.  Wash your hands often with soap and water for 20 seconds or clean your hands with an alcohol-based hand sanitizer that contains at least 60% alcohol.  Do not share personal  items.  Notify your provider: if you are in close contact with someone who has COVID  or if you develop a fever of 100.4 or greater, sneezing, cough, sore throat, shortness of breath or body aches.             Do not wear jewelry or makeup Do not wear lotions, powders, perfumes/colognes, or deodorant. Do not shave 48 hours prior to surgery.  Men may shave face and neck. Do not bring valuables to the hospital. DO Not wear nail polish, gel polish, artificial nails, or any other type of covering on natural nails including finger and toenails. If patients have artificial nails, gel coating, etc. that need to be removed by a nail salon, please have this removed prior to surgery or surgery may need to be canceled/delayed if the surgeon/ anesthesia feels like the patient is unable to be adequately monitored.             [facility] is not responsible for any belongings or valuables.  Do NOT Smoke (Tobacco/Vaping)  24 hours prior to your procedure  If you use a CPAP at night, you may bring your mask for your overnight stay.   Contacts, glasses, hearing aids, dentures or partials may not be worn into surgery, please bring cases for these belongings   For patients admitted to the hospital, discharge time will be determined by your treatment team.  Patients discharged the day of surgery will not be allowed to drive home, and someone needs to stay with them for 24 hours.  NO VISITORS WILL BE ALLOWED IN PRE-OP WHERE PATIENTS ARE PREPPED FOR SURGERY.  ONLY 1 SUPPORT PERSON MAY BE PRESENT IN THE WAITING ROOM WHILE YOU ARE IN SURGERY.  IF YOU ARE TO BE ADMITTED, ONCE YOU ARE IN YOUR ROOM YOU WILL BE ALLOWED TWO (2) VISITORS. 1 (ONE) VISITOR MAY STAY OVERNIGHT BUT MUST ARRIVE TO THE ROOM BY 8pm.  Minor children may have two parents present. Special consideration for safety and communication needs will be reviewed on a case by case basis.  Special instructions:    Oral Hygiene is also important to  reduce your risk of infection.  Remember - BRUSH YOUR TEETH THE MORNING OF SURGERY WITH YOUR REGULAR TOOTHPASTE   [facility]- Preparing For Surgery  Before surgery, you can play an important role. Because skin is not sterile, your skin needs to be as free of germs as possible. You can reduce the number of germs on your skin by washing with CHG (chlorahexidine gluconate) Soap before surgery.  CHG is an antiseptic cleaner which kills germs and bonds with the skin to continue killing germs even after washing.     Please do not use if you have an allergy to CHG or antibacterial soaps. If your skin becomes reddened/irritated stop using the CHG.  Do not shave (including legs and underarms) for at least 48 hours prior to first CHG shower. It is OK to shave your face.  Please follow these instructions carefully.     Shower the NIGHT BEFORE SURGERY and the MORNING OF SURGERY with CHG Soap.   If you chose to wash your hair, wash your hair first as usual with your normal shampoo. After you shampoo, rinse your hair and body thoroughly to remove the shampoo.  Then [employer] and genitals (private parts) with your normal soap and rinse thoroughly to remove soap.  After that Use CHG Soap as you would any other liquid soap. You can apply CHG directly to the skin and wash gently with a scrungie or a clean washcloth.   Apply the CHG Soap to your body ONLY FROM THE NECK DOWN.  Do not use on open wounds or open sores. Avoid contact with your eyes, ears, mouth and genitals (private parts). Wash Face and genitals (private parts)  with your normal soap.   Wash thoroughly, paying special attention to the area where your surgery will be performed.  Thoroughly rinse your body with warm water from the neck down.  DO NOT shower/wash with your normal soap after using and rinsing off the CHG Soap.  Pat yourself dry with a CLEAN TOWEL.  Wear CLEAN PAJAMAS to bed the night before surgery  Place CLEAN SHEETS on your  bed the night before your surgery  DO NOT SLEEP WITH PETS.   Day of Surgery:  Take a shower with CHG soap. Wear Clean/Comfortable clothing the morning of surgery Do not apply any deodorants/lotions.   Remember to brush your teeth WITH YOUR REGULAR TOOTHPASTE.   Please read over the following fact sheets that you were given.

## 2021-06-14 ENCOUNTER — Encounter (HOSPITAL_COMMUNITY): Payer: Self-pay

## 2021-06-14 ENCOUNTER — Other Ambulatory Visit: Payer: Self-pay

## 2021-06-14 ENCOUNTER — Encounter (HOSPITAL_COMMUNITY)
Admission: RE | Admit: 2021-06-14 | Discharge: 2021-06-14 | Disposition: A | Discharge status: Home / Self Care | Payer: 59 | Source: Ambulatory Visit | Attending: Orthopedic Surgery | Admitting: Orthopedic Surgery

## 2021-06-14 VITALS — BP 108/86 | HR 71 | Temp 98.1°F | Resp 17 | Ht 74.0 in | Wt 179.6 lb

## 2021-06-14 DIAGNOSIS — Z20822 Contact with and (suspected) exposure to covid-19: Secondary | ICD-10-CM | POA: Diagnosis not present

## 2021-06-14 DIAGNOSIS — Z01818 Encounter for other preprocedural examination: Secondary | ICD-10-CM

## 2021-06-14 DIAGNOSIS — Z01812 Encounter for preprocedural laboratory examination: Secondary | ICD-10-CM | POA: Diagnosis present

## 2021-06-14 HISTORY — DX: Unspecified osteoarthritis, unspecified site: M19.90

## 2021-06-14 LAB — CBC
HCT: 48.7 % (ref 39.0–52.0)
Hemoglobin: 16 g/dL (ref 13.0–17.0)
MCH: 29.7 pg (ref 26.0–34.0)
MCHC: 32.9 g/dL (ref 30.0–36.0)
MCV: 90.4 fL (ref 80.0–100.0)
Platelets: 347 10*3/uL (ref 150–400)
RBC: 5.39 MIL/uL (ref 4.22–5.81)
RDW: 12.1 % (ref 11.5–15.5)
WBC: 10.2 10*3/uL (ref 4.0–10.5)
nRBC: 0 % (ref 0.0–0.2)

## 2021-06-14 LAB — SURGICAL PCR SCREEN
MRSA, PCR: NEGATIVE
Staphylococcus aureus: NEGATIVE

## 2021-06-14 LAB — SARS CORONAVIRUS 2 (TAT 6-24 HRS): SARS Coronavirus 2: NEGATIVE

## 2021-06-14 NOTE — Progress Notes (Signed)
PCP - no pcp Cardiologist - denies  PPM/ICD - denies   Chest x-ray - n/a EKG - n/a Stress Test -denies  ECHO - denies Cardiac Cath - denies  Sleep Study - denies   As of today, STOP taking any Aspirin (unless otherwise instructed by your surgeon) Aleve, Naproxen, Ibuprofen, Motrin, Advil, Goody's, BC's, all herbal medications, fish oil, and all vitamins.  ERAS Protcol -yes PRE-SURGERY Ensure or G2- ensure ordered and give  COVID TEST- 06/14/21 in PAT   Anesthesia review: no  Patient denies shortness of breath, fever, cough and chest pain at PAT appointment   All instructions explained to the patient, with a verbal understanding of the material. Patient agrees to go over the instructions while at home for a better understanding. Patient also instructed to self quarantine after being tested for COVID-19. The opportunity to ask questions was provided.

## 2021-06-15 ENCOUNTER — Ambulatory Visit: Payer: Managed Care, Other (non HMO) | Admitting: Orthopedic Surgery

## 2021-06-15 NOTE — Anesthesia Preprocedure Evaluation (Addendum)
Anesthesia Evaluation  Patient identified by MRN, date of birth, ID band Patient awake    Reviewed: Allergy & Precautions, NPO status , Patient's Chart, lab work & pertinent test results  History of Anesthesia Complications Negative for: history of anesthetic complications  Airway Mallampati: II  TM Distance: >3 FB Neck ROM: Full    Dental  (+) Dental Advisory Given, Chipped   Pulmonary Current Smoker and Patient abstained from smoking.,    Pulmonary exam normal        Cardiovascular negative cardio ROS Normal cardiovascular exam     Neuro/Psych negative neurological ROS  negative psych ROS   GI/Hepatic negative GI ROS, Neg liver ROS,   Endo/Other  negative endocrine ROS  Renal/GU negative Renal ROS     Musculoskeletal  (+) Arthritis ,   Abdominal   Peds  Hematology negative hematology ROS (+)   Anesthesia Other Findings   Reproductive/Obstetrics                            Anesthesia Physical Anesthesia Plan  ASA: 2  Anesthesia Plan: General   Post-op Pain Management: Tylenol PO (pre-op) and Celebrex PO (pre-op)   Induction: Intravenous  PONV Risk Score and Plan: 2 and Treatment may vary due to age or medical condition, Ondansetron, Dexamethasone and Midazolam  Airway Management Planned: LMA  Additional Equipment: None  Intra-op Plan:   Post-operative Plan: Extubation in OR  Informed Consent: I have reviewed the patients History and Physical, chart, labs and discussed the procedure including the risks, benefits and alternatives for the proposed anesthesia with the patient or authorized representative who has indicated his/her understanding and acceptance.     Dental advisory given  Plan Discussed with: CRNA and Anesthesiologist  Anesthesia Plan Comments:        Anesthesia Quick Evaluation

## 2021-06-16 ENCOUNTER — Encounter (HOSPITAL_COMMUNITY)
Admission: RE | Disposition: A | Discharge status: Home / Self Care | Payer: Self-pay | Source: Home / Self Care | Attending: Orthopedic Surgery

## 2021-06-16 ENCOUNTER — Ambulatory Visit (HOSPITAL_COMMUNITY)
Admission: RE | Admit: 2021-06-16 | Discharge: 2021-06-16 | Disposition: A | Discharge status: Home / Self Care | Payer: 59 | Attending: Orthopedic Surgery | Admitting: Orthopedic Surgery

## 2021-06-16 ENCOUNTER — Ambulatory Visit (HOSPITAL_COMMUNITY): Payer: 59 | Admitting: Anesthesiology

## 2021-06-16 ENCOUNTER — Ambulatory Visit (HOSPITAL_COMMUNITY): Payer: 59

## 2021-06-16 DIAGNOSIS — M19171 Post-traumatic osteoarthritis, right ankle and foot: Secondary | ICD-10-CM

## 2021-06-16 DIAGNOSIS — M12571 Traumatic arthropathy, right ankle and foot: Secondary | ICD-10-CM | POA: Insufficient documentation

## 2021-06-16 DIAGNOSIS — S92001S Unspecified fracture of right calcaneus, sequela: Secondary | ICD-10-CM | POA: Diagnosis not present

## 2021-06-16 DIAGNOSIS — M6701 Short Achilles tendon (acquired), right ankle: Secondary | ICD-10-CM

## 2021-06-16 DIAGNOSIS — M216X1 Other acquired deformities of right foot: Secondary | ICD-10-CM | POA: Insufficient documentation

## 2021-06-16 DIAGNOSIS — X58XXXS Exposure to other specified factors, sequela: Secondary | ICD-10-CM | POA: Diagnosis not present

## 2021-06-16 DIAGNOSIS — F17210 Nicotine dependence, cigarettes, uncomplicated: Secondary | ICD-10-CM | POA: Diagnosis not present

## 2021-06-16 DIAGNOSIS — S92031S Displaced avulsion fracture of tuberosity of right calcaneus, sequela: Secondary | ICD-10-CM | POA: Diagnosis not present

## 2021-06-16 DIAGNOSIS — S92031A Displaced avulsion fracture of tuberosity of right calcaneus, initial encounter for closed fracture: Secondary | ICD-10-CM

## 2021-06-16 HISTORY — PX: GASTROCNEMIUS RECESSION: SHX863

## 2021-06-16 HISTORY — PX: FOOT ARTHRODESIS: SHX1655

## 2021-06-16 LAB — TYPE AND SCREEN
ABO/RH(D): B NEG
Antibody Screen: NEGATIVE

## 2021-06-16 SURGERY — FUSION, JOINT, FOOT
Anesthesia: General | Site: Foot | Laterality: Right

## 2021-06-16 MED ORDER — MIDAZOLAM HCL 2 MG/2ML IJ SOLN
INTRAMUSCULAR | Status: AC
  Filled 2021-06-16: qty 2

## 2021-06-16 MED ORDER — 0.9 % SODIUM CHLORIDE (POUR BTL) OPTIME
TOPICAL | Status: DC | PRN
  Administered 2021-06-16 (×2): 1000 mL

## 2021-06-16 MED ORDER — FENTANYL CITRATE (PF) 100 MCG/2ML IJ SOLN
INTRAMUSCULAR | Status: AC
  Filled 2021-06-16: qty 2

## 2021-06-16 MED ORDER — PROPOFOL 10 MG/ML IV BOLUS
INTRAVENOUS | Status: AC
  Filled 2021-06-16: qty 20

## 2021-06-16 MED ORDER — ONDANSETRON HCL 4 MG/2ML IJ SOLN
INTRAMUSCULAR | Status: AC
  Filled 2021-06-16: qty 2

## 2021-06-16 MED ORDER — ACETAMINOPHEN 500 MG PO TABS: 1000.0000 mg | ORAL_TABLET | Freq: Once | ORAL | Status: DC

## 2021-06-16 MED ORDER — CHLORHEXIDINE GLUCONATE 0.12 % MT SOLN: 15.0000 mL | Freq: Once | OROMUCOSAL | Status: AC

## 2021-06-16 MED ORDER — CELECOXIB 200 MG PO CAPS: 200.0000 mg | ORAL_CAPSULE | Freq: Once | ORAL | Status: AC

## 2021-06-16 MED ORDER — CEFAZOLIN SODIUM-DEXTROSE 2-4 GM/100ML-% IV SOLN: 2.0000 g | INTRAVENOUS | Status: DC

## 2021-06-16 MED ORDER — CLINDAMYCIN PHOSPHATE 900 MG/50ML IV SOLN
INTRAVENOUS | Status: AC
  Filled 2021-06-16: qty 50

## 2021-06-16 MED ORDER — PROMETHAZINE HCL 25 MG/ML IJ SOLN: 6.2500 mg | INTRAMUSCULAR | Status: DC | PRN

## 2021-06-16 MED ORDER — CLINDAMYCIN PHOSPHATE 900 MG/50ML IV SOLN: 900.0000 mg | INTRAVENOUS | Status: DC

## 2021-06-16 MED ORDER — ONDANSETRON HCL 4 MG/2ML IJ SOLN
INTRAMUSCULAR | Status: DC | PRN
Start: 2021-06-16 — End: 2021-06-16
  Administered 2021-06-16: 4 mg via INTRAVENOUS

## 2021-06-16 MED ORDER — FENTANYL CITRATE (PF) 250 MCG/5ML IJ SOLN
INTRAMUSCULAR | Status: AC
  Filled 2021-06-16: qty 5

## 2021-06-16 MED ORDER — LIDOCAINE 2% (20 MG/ML) 5 ML SYRINGE
INTRAMUSCULAR | Status: AC
  Filled 2021-06-16: qty 5

## 2021-06-16 MED ORDER — LIDOCAINE 2% (20 MG/ML) 5 ML SYRINGE
INTRAMUSCULAR | Status: DC | PRN
Start: 2021-06-16 — End: 2021-06-16
  Administered 2021-06-16: 60 mg via INTRAVENOUS

## 2021-06-16 MED ORDER — FENTANYL CITRATE (PF) 100 MCG/2ML IJ SOLN
25.0000 ug | INTRAMUSCULAR | Status: DC | PRN
  Administered 2021-06-16 (×2): 50 ug via INTRAVENOUS

## 2021-06-16 MED ORDER — PROPOFOL 10 MG/ML IV BOLUS
INTRAVENOUS | Status: DC | PRN
  Administered 2021-06-16: 260 mg via INTRAVENOUS

## 2021-06-16 MED ORDER — ACETAMINOPHEN 500 MG PO TABS
ORAL_TABLET | ORAL | Status: AC
  Filled 2021-06-16: qty 2

## 2021-06-16 MED ORDER — CEFAZOLIN SODIUM-DEXTROSE 2-4 GM/100ML-% IV SOLN
INTRAVENOUS | Status: AC
  Filled 2021-06-16: qty 100

## 2021-06-16 MED ORDER — FENTANYL CITRATE (PF) 250 MCG/5ML IJ SOLN
INTRAMUSCULAR | Status: DC | PRN
  Administered 2021-06-16: 25 ug via INTRAVENOUS
  Administered 2021-06-16 (×3): 50 ug via INTRAVENOUS

## 2021-06-16 MED ORDER — EPHEDRINE SULFATE-NACL 50-0.9 MG/10ML-% IV SOSY
PREFILLED_SYRINGE | INTRAVENOUS | Status: DC | PRN
  Administered 2021-06-16: 10 mg via INTRAVENOUS
  Administered 2021-06-16: 5 mg via INTRAVENOUS

## 2021-06-16 MED ORDER — CEFAZOLIN SODIUM-DEXTROSE 2-4 GM/100ML-% IV SOLN
2.0000 g | INTRAVENOUS | Status: AC
  Administered 2021-06-16: 2 g via INTRAVENOUS

## 2021-06-16 MED ORDER — PHENYLEPHRINE 40 MCG/ML (10ML) SYRINGE FOR IV PUSH (FOR BLOOD PRESSURE SUPPORT)
PREFILLED_SYRINGE | INTRAVENOUS | Status: DC | PRN
Start: 2021-06-16 — End: 2021-06-16
  Administered 2021-06-16: 40 ug via INTRAVENOUS

## 2021-06-16 MED ORDER — CHLORHEXIDINE GLUCONATE 0.12 % MT SOLN
OROMUCOSAL | Status: AC
  Administered 2021-06-16: 15 mL via OROMUCOSAL
  Filled 2021-06-16: qty 15

## 2021-06-16 MED ORDER — MIDAZOLAM HCL 2 MG/2ML IJ SOLN
INTRAMUSCULAR | Status: DC | PRN
  Administered 2021-06-16: 2 mg via INTRAVENOUS

## 2021-06-16 MED ORDER — CELECOXIB 200 MG PO CAPS
ORAL_CAPSULE | ORAL | Status: AC
  Administered 2021-06-16: 200 mg via ORAL
  Filled 2021-06-16: qty 1

## 2021-06-16 MED ORDER — OXYCODONE-ACETAMINOPHEN 5-325 MG PO TABS
1.0000 | ORAL_TABLET | ORAL | 0 refills | Status: DC | PRN
Start: 2021-06-16 — End: 2021-06-23

## 2021-06-16 MED ORDER — OXYCODONE HCL 5 MG/5ML PO SOLN: 5.0000 mg | Freq: Once | ORAL | Status: DC | PRN

## 2021-06-16 MED ORDER — DEXAMETHASONE SODIUM PHOSPHATE 10 MG/ML IJ SOLN
INTRAMUSCULAR | Status: DC | PRN
  Administered 2021-06-16: 10 mg via INTRAVENOUS

## 2021-06-16 MED ORDER — OXYCODONE HCL 5 MG PO TABS: 5.0000 mg | ORAL_TABLET | Freq: Once | ORAL | Status: DC | PRN

## 2021-06-16 MED ORDER — PHENYLEPHRINE 40 MCG/ML (10ML) SYRINGE FOR IV PUSH (FOR BLOOD PRESSURE SUPPORT)
PREFILLED_SYRINGE | INTRAVENOUS | Status: AC
  Filled 2021-06-16: qty 10

## 2021-06-16 MED ORDER — ORAL CARE MOUTH RINSE: 15.0000 mL | Freq: Once | OROMUCOSAL | Status: AC

## 2021-06-16 MED ORDER — LACTATED RINGERS IV SOLN: INTRAVENOUS | Status: DC

## 2021-06-16 MED ORDER — DEXAMETHASONE SODIUM PHOSPHATE 10 MG/ML IJ SOLN
INTRAMUSCULAR | Status: AC
  Filled 2021-06-16: qty 1

## 2021-06-16 MED ORDER — SODIUM CHLORIDE 0.45 % IV SOLN: INTRAVENOUS | Status: DC

## 2021-06-16 SURGICAL SUPPLY — 51 items
BAG COUNTER SPONGE SURGICOUNT (BAG) ×3 IMPLANT
BANDAGE ESMARK 6X9 LF (GAUZE/BANDAGES/DRESSINGS) IMPLANT
BIT DRILL 4.5 TYB 9 (BIT) ×1 IMPLANT
BLADE SAW SGTL HD 18.5X60.5X1. (BLADE) ×2 IMPLANT
BLADE SURG 10 STRL SS (BLADE) ×1 IMPLANT
BNDG COHESIVE 4X5 TAN ST LF (GAUZE/BANDAGES/DRESSINGS) ×1 IMPLANT
BNDG COHESIVE 4X5 TAN STRL (GAUZE/BANDAGES/DRESSINGS) ×3 IMPLANT
BNDG COHESIVE 6X5 TAN STRL LF (GAUZE/BANDAGES/DRESSINGS) ×2 IMPLANT
BNDG ESMARK 6X9 LF (GAUZE/BANDAGES/DRESSINGS)
BNDG GAUZE ELAST 4 BULKY (GAUZE/BANDAGES/DRESSINGS) ×4 IMPLANT
BUR SURG 4X8 MED (BURR) IMPLANT
BURR SURG 4X8 MED (BURR) ×6
COTTON STERILE ROLL (GAUZE/BANDAGES/DRESSINGS) ×2 IMPLANT
COVER MAYO STAND STRL (DRAPES) ×3 IMPLANT
COVER SURGICAL LIGHT HANDLE (MISCELLANEOUS) ×6 IMPLANT
DRAPE INCISE IOBAN 66X45 STRL (DRAPES) ×3 IMPLANT
DRAPE OEC MINIVIEW 54X84 (DRAPES) ×1 IMPLANT
DRAPE U-SHAPE 47X51 STRL (DRAPES) ×3 IMPLANT
DRSG ADAPTIC 3X8 NADH LF (GAUZE/BANDAGES/DRESSINGS) ×2 IMPLANT
DRSG EMULSION OIL 3X3 NADH (GAUZE/BANDAGES/DRESSINGS) ×5 IMPLANT
DURAPREP 26ML APPLICATOR (WOUND CARE) ×3 IMPLANT
ELECT REM PT RETURN 9FT ADLT (ELECTROSURGICAL) ×3
ELECTRODE REM PT RTRN 9FT ADLT (ELECTROSURGICAL) ×2 IMPLANT
GAUZE SPONGE 4X4 12PLY STRL (GAUZE/BANDAGES/DRESSINGS) ×3 IMPLANT
GLOVE SURG ORTHO LTX SZ9 (GLOVE) ×3 IMPLANT
GLOVE SURG UNDER POLY LF SZ9 (GLOVE) ×3 IMPLANT
GOWN STRL REUS W/ TWL XL LVL3 (GOWN DISPOSABLE) ×6 IMPLANT
GOWN STRL REUS W/TWL XL LVL3 (GOWN DISPOSABLE) ×3
GUIDEWIRE TYB 2X9 (WIRE) ×3 IMPLANT
KIT BASIN OR (CUSTOM PROCEDURE TRAY) ×3 IMPLANT
KIT TURNOVER KIT B (KITS) ×3 IMPLANT
MANIFOLD NEPTUNE II (INSTRUMENTS) ×3 IMPLANT
NS IRRIG 1000ML POUR BTL (IV SOLUTION) ×4 IMPLANT
PACK ORTHO EXTREMITY (CUSTOM PROCEDURE TRAY) ×3 IMPLANT
PAD ABD 8X10 STRL (GAUZE/BANDAGES/DRESSINGS) ×1 IMPLANT
PAD ARMBOARD 7.5X6 YLW CONV (MISCELLANEOUS) ×6 IMPLANT
PAD CAST 4YDX4 CTTN HI CHSV (CAST SUPPLIES) ×2 IMPLANT
PADDING CAST ABS 4INX4YD NS (CAST SUPPLIES)
PADDING CAST ABS COTTON 4X4 ST (CAST SUPPLIES) ×2 IMPLANT
PADDING CAST COTTON 4X4 STRL (CAST SUPPLIES)
PUTTY DBM STAGRAFT PLUS 5CC (Putty) ×1 IMPLANT
SCREW CANN HDLS SHRT 6.5X75 (Screw) ×2 IMPLANT
SPONGE T-LAP 18X18 ~~LOC~~+RFID (SPONGE) ×3 IMPLANT
SUCTION FRAZIER HANDLE 10FR (MISCELLANEOUS) ×1
SUCTION TUBE FRAZIER 10FR DISP (MISCELLANEOUS) ×2 IMPLANT
SUT ETHILON 2 0 PSLX (SUTURE) ×8 IMPLANT
TOWEL GREEN STERILE (TOWEL DISPOSABLE) ×3 IMPLANT
TOWEL GREEN STERILE FF (TOWEL DISPOSABLE) ×3 IMPLANT
TUBE CONNECTING 12X1/4 (SUCTIONS) ×3 IMPLANT
UNDERPAD 30X36 HEAVY ABSORB (UNDERPADS AND DIAPERS) ×3 IMPLANT
WATER STERILE IRR 1000ML POUR (IV SOLUTION) ×3 IMPLANT
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
## 2021-06-16 NOTE — Transfer of Care (Signed)
Immediate Anesthesia Transfer of Care Note  Patient: Anthony Santos  Procedure(s) Performed: RIGHT SUBTALAR FUSION, EXCISION PARTIAL CALCANEOUS (Right: Foot) RIGHT GASTROCNEMIUS RECESSION (Right)  Patient Location: PACU  Anesthesia Type:General  Level of Consciousness: drowsy  Airway & Oxygen Therapy: Patient Spontanous Breathing and Patient connected to face mask oxygen  Post-op Assessment: Report given to RN and Post -op Vital signs reviewed and stable  Post vital signs: Reviewed and stable  Last Vitals:  Vitals Value Taken Time  BP 112/69   Temp    Pulse 55   Resp 12   SpO2 100     Last Pain:  Vitals:   06/16/21 0545  TempSrc: Oral         Complications: No notable events documented.

## 2021-06-16 NOTE — Op Note (Signed)
06/16/2021  9:00 AM  PATIENT:  Anthony Santos    PRE-OPERATIVE DIAGNOSIS:  Traumatic Arthritis Right Foot  POST-OPERATIVE DIAGNOSIS:  Same  PROCEDURE:  RIGHT SUBTALAR FUSION, EXCISION PARTIAL CALCANEOUS, RIGHT GASTROCNEMIUS RECESSION C arm fluoroscopy was completed to verify reduction of internal fixation.   SURGEON:  Newt Minion, MD  PHYSICIAN ASSISTANT:None ANESTHESIA:   General  PREOPERATIVE INDICATIONS:  Anthony Santos is a  46 y.o. male with a diagnosis of Traumatic Arthritis Right Foot who failed conservative measures and elected for surgical management.    The risks benefits and alternatives were discussed with the patient preoperatively including but not limited to the risks of infection, bleeding, nerve injury, cardiopulmonary complications, the need for revision surgery, among others, and the patient was willing to proceed.  OPERATIVE IMPLANTS: 6.5 headless cannulated screws x2 with 5 cc of demineralized bone matrix   @ENCIMAGES @  OPERATIVE FINDINGS: C-arm fluoroscopy verified reduction of the subtalar joint excision of the calcaneal tuberosity.  OPERATIVE PROCEDURE: Patient was brought the operating room underwent a general anesthetic.  After adequate levels anesthesia were obtained patient's right lower extremity was prepped using DuraPrep draped into a sterile field a timeout was called.  A medial longitudinal incision was made over the gastrocnemius fascia 15 cm proximal to the medial malleolus.  Under direct visualization and the gastrocnemius fascia was released this took dorsiflexion from just short of neutral to about 30 degrees past neutral.  The wound was irrigated with normal saline and closed with 2-0 nylon.  Attention was then focused on the posterior Haglund's deformity secondary to the calcaneus fracture.  A posterior medial longitudinal incision was made just medial to the Achilles tendon.  Dissection was carried down between the Achilles and the large calcaneal  spur under direct visualization a bur was used to resect the bony prominence of the calcaneus.  The Achilles tendon was intact the wound was irrigated with normal saline incision closed using 2-0 nylon with a Allgower Donati suture technique.  Attention was then focused on the subtalar joint.  An oblique incision was made over the sinus Tarsi a retractor was placed to protect the peroneal tendons and under direct visualization a bur was used to debride the subtalar joint this was further debrided with a curette after bleeding viable edges were obtained this was injected with 5 cc of demineralized bone matrix.  C arm fluoroscopy was then used and 2 K wires were placed in the calcaneus into the talus to stabilize the subtalar joint.  275 mm screws were placed C-arm fluoroscopy verified reduction of the subtalar fusion.  The incision was closed using 2-0 nylon calcaneal incision was then closed with 2-0 nylon and a sterile dressing was applied patient was extubated taken the PACU in stable condition.   DISCHARGE PLANNING:  Antibiotic duration: Preoperative antibiotics Ancef  Weightbearing: Nonweightbearing on the right  Pain medication: Prescription for Percocet  Dressing care/ Wound VAC: Follow-up in 1 week to change the dressing  Ambulatory devices: Crutches  Discharge to: Home.  Follow-up: In the office 1 week post operative.

## 2021-06-16 NOTE — Anesthesia Procedure Notes (Addendum)
Procedure Name: LMA Insertion Date/Time: 06/16/2021 7:42 AM Performed by: Zollie Beckers, CRNA Pre-anesthesia Checklist: Patient identified, Emergency Drugs available, Suction available and Patient being monitored Patient Re-evaluated:Patient Re-evaluated prior to induction Oxygen Delivery Method: Circle System Utilized Preoxygenation: Pre-oxygenation with 100% oxygen Induction Type: IV induction Ventilation: Mask ventilation without difficulty LMA: LMA inserted LMA Size: 5.0 Number of attempts: 1 Placement Confirmation: positive ETCO2 Tube secured with: Tape Dental Injury: Teeth and Oropharynx as per pre-operative assessment

## 2021-06-16 NOTE — Progress Notes (Signed)
Orthopedic Tech Progress Note Patient Details:  Anthony Santos [birth year] [id number]  Ortho Devices Type of Ortho Device: CAM walker, Crutches Ortho Device/Splint Location: right Ortho Device/Splint Interventions: Ordered, Application, Adjustment   Post Interventions Patient Tolerated: Well Instructions Provided: Adjustment of device  Anthony Santos 06/16/2021, 10:13 AM Applied cam walker and delivered crutches. This was a phone order

## 2021-06-16 NOTE — Anesthesia Postprocedure Evaluation (Signed)
Anesthesia Post Note  Patient: Anthony Santos  Procedure(s) Performed: RIGHT SUBTALAR FUSION, EXCISION PARTIAL CALCANEOUS (Right: Foot) RIGHT GASTROCNEMIUS RECESSION (Right)     Patient location during evaluation: PACU Anesthesia Type: General Level of consciousness: awake and alert Pain management: pain level controlled Vital Signs Assessment: post-procedure vital signs reviewed and stable Respiratory status: spontaneous breathing, nonlabored ventilation and respiratory function stable Cardiovascular status: stable and blood pressure returned to baseline Anesthetic complications: no   No notable events documented.  Last Vitals:  Vitals:   06/16/21 0912 06/16/21 0927  BP: (!) 134/97 131/87  Pulse: 77 76  Resp: 13 14  Temp:  36.8 C  SpO2: 97% 98%    Last Pain:  Vitals:   06/16/21 0927  TempSrc:   PainSc: 3                  Beryle Lathe

## 2021-06-19 ENCOUNTER — Encounter (HOSPITAL_COMMUNITY): Payer: Self-pay | Admitting: Orthopedic Surgery

## 2021-06-19 ENCOUNTER — Telehealth: Payer: Self-pay

## 2021-06-19 NOTE — Telephone Encounter (Signed)
Patient called into the office stating the medication that was sent in isn't working he is stating that he can't sleep and the pain is just overall extremely bad. Patient just had surgery Friday   Please advise

## 2021-06-19 NOTE — Telephone Encounter (Signed)
I called pt and made post op appt for this Friday with Erin. Advised the pt to really work on keeping the swelling under control. To elevate his foot higher than his heart to use ice 15-20 minutes at a time 3-4 times a day. Per Dr. Sharol Given protocol can use 2 aleve twice a day and pain medication as directed. Pt also advised that he could remove the coban dressing only and see if this relieves some of the pressure. He will call with any questions.

## 2021-06-23 ENCOUNTER — Encounter: Payer: Self-pay | Admitting: Family

## 2021-06-23 ENCOUNTER — Ambulatory Visit (INDEPENDENT_AMBULATORY_CARE_PROVIDER_SITE_OTHER): Payer: 59 | Admitting: Family

## 2021-06-23 ENCOUNTER — Other Ambulatory Visit: Payer: Self-pay

## 2021-06-23 DIAGNOSIS — M25571 Pain in right ankle and joints of right foot: Secondary | ICD-10-CM

## 2021-06-23 MED ORDER — DOXYCYCLINE HYCLATE 100 MG PO TABS: 100.0000 mg | ORAL_TABLET | Freq: Two times a day (BID) | ORAL | 0 refills | Status: DC

## 2021-06-23 MED ORDER — OXYCODONE-ACETAMINOPHEN 5-325 MG PO TABS: 1.0000 | ORAL_TABLET | ORAL | 0 refills | Status: AC | PRN

## 2021-06-27 ENCOUNTER — Telehealth: Payer: Self-pay

## 2021-06-27 ENCOUNTER — Other Ambulatory Visit: Payer: Self-pay

## 2021-06-27 ENCOUNTER — Ambulatory Visit (INDEPENDENT_AMBULATORY_CARE_PROVIDER_SITE_OTHER): Payer: 59 | Admitting: Orthopedic Surgery

## 2021-06-27 DIAGNOSIS — S92041S Displaced other fracture of tuberosity of right calcaneus, sequela: Secondary | ICD-10-CM

## 2021-06-27 DIAGNOSIS — M79671 Pain in right foot: Secondary | ICD-10-CM

## 2021-06-27 NOTE — Telephone Encounter (Signed)
Pt has appt this afternoon

## 2021-06-27 NOTE — Telephone Encounter (Signed)
Patient called triage stating that he saw Erin on Friday and she gave antibiotics but he states foot is much more red with swelling Denny Peon advised me to have him come in this afternoon. I did not put him on the schedule. He has to see if he can even get a ride. He will call back to let us know if he can come
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
## 2021-06-30 ENCOUNTER — Encounter: Payer: 59 | Admitting: Family

## 2021-07-02 ENCOUNTER — Encounter: Payer: Self-pay | Admitting: Orthopedic Surgery
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
## 2021-07-04 ENCOUNTER — Other Ambulatory Visit: Payer: Self-pay

## 2021-07-04 ENCOUNTER — Encounter: Payer: Self-pay | Admitting: Family

## 2021-07-04 ENCOUNTER — Ambulatory Visit (INDEPENDENT_AMBULATORY_CARE_PROVIDER_SITE_OTHER): Payer: 59 | Admitting: Orthopedic Surgery

## 2021-07-04 DIAGNOSIS — M25571 Pain in right ankle and joints of right foot: Secondary | ICD-10-CM

## 2021-07-04 DIAGNOSIS — S92041S Displaced other fracture of tuberosity of right calcaneus, sequela: Secondary | ICD-10-CM
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
## 2021-07-11 ENCOUNTER — Other Ambulatory Visit: Payer: Self-pay

## 2021-07-11 ENCOUNTER — Ambulatory Visit (INDEPENDENT_AMBULATORY_CARE_PROVIDER_SITE_OTHER): Payer: 59

## 2021-07-11 ENCOUNTER — Encounter: Payer: Self-pay | Admitting: Family

## 2021-07-11 ENCOUNTER — Ambulatory Visit (INDEPENDENT_AMBULATORY_CARE_PROVIDER_SITE_OTHER): Payer: 59 | Admitting: Family

## 2021-07-11 DIAGNOSIS — Z9889 Other specified postprocedural states: Secondary | ICD-10-CM | POA: Diagnosis not present
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
## 2021-07-20 ENCOUNTER — Other Ambulatory Visit: Payer: Self-pay | Admitting: Family

## 2021-07-25 ENCOUNTER — Ambulatory Visit (INDEPENDENT_AMBULATORY_CARE_PROVIDER_SITE_OTHER): Payer: 59 | Admitting: Family

## 2021-07-25 ENCOUNTER — Encounter: Payer: Self-pay | Admitting: Family

## 2021-07-25 DIAGNOSIS — Z9889 Other specified postprocedural states: Secondary | ICD-10-CM
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
## 2021-07-26 ENCOUNTER — Other Ambulatory Visit: Payer: Self-pay

## 2021-07-26 ENCOUNTER — Ambulatory Visit (INDEPENDENT_AMBULATORY_CARE_PROVIDER_SITE_OTHER): Payer: 59 | Admitting: Physical Therapy

## 2021-07-26 ENCOUNTER — Encounter: Payer: Self-pay | Admitting: Physical Therapy

## 2021-07-26 DIAGNOSIS — M25671 Stiffness of right ankle, not elsewhere classified: Secondary | ICD-10-CM | POA: Diagnosis not present

## 2021-07-26 DIAGNOSIS — M25571 Pain in right ankle and joints of right foot: Secondary | ICD-10-CM
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
## 2021-07-31 ENCOUNTER — Other Ambulatory Visit: Payer: Self-pay

## 2021-07-31 ENCOUNTER — Encounter: Payer: Self-pay | Admitting: Physical Therapy

## 2021-07-31 ENCOUNTER — Ambulatory Visit (INDEPENDENT_AMBULATORY_CARE_PROVIDER_SITE_OTHER): Payer: 59 | Admitting: Physical Therapy

## 2021-07-31 DIAGNOSIS — M25671 Stiffness of right ankle, not elsewhere classified: Secondary | ICD-10-CM | POA: Diagnosis not present

## 2021-07-31 DIAGNOSIS — M25571 Pain in right ankle and joints of right foot: Secondary | ICD-10-CM | POA: Diagnosis not present
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
## 2021-08-02 ENCOUNTER — Ambulatory Visit (INDEPENDENT_AMBULATORY_CARE_PROVIDER_SITE_OTHER): Payer: 59 | Admitting: Physical Therapy

## 2021-08-02 ENCOUNTER — Other Ambulatory Visit: Payer: Self-pay

## 2021-08-02 ENCOUNTER — Encounter: Payer: Self-pay | Admitting: Physical Therapy

## 2021-08-02 DIAGNOSIS — M25671 Stiffness of right ankle, not elsewhere classified: Secondary | ICD-10-CM | POA: Diagnosis not present

## 2021-08-02 DIAGNOSIS — M25571 Pain in right ankle and joints of right foot: Secondary | ICD-10-CM | POA: Diagnosis not present
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
## 2021-08-09 ENCOUNTER — Encounter: Payer: 59 | Admitting: Physical Therapy

## 2021-08-10 ENCOUNTER — Encounter: Payer: 59 | Admitting: Rehabilitative and Restorative Service Providers"

## 2021-08-10 ENCOUNTER — Ambulatory Visit (INDEPENDENT_AMBULATORY_CARE_PROVIDER_SITE_OTHER): Payer: 59 | Admitting: Orthopedic Surgery

## 2021-08-10 DIAGNOSIS — S92041S Displaced other fracture of tuberosity of right calcaneus, sequela: Secondary | ICD-10-CM

## 2021-08-10 MED ORDER — MUPIROCIN 2 % EX OINT: 1.0000 "application " | TOPICAL_OINTMENT | Freq: Two times a day (BID) | CUTANEOUS | 3 refills | Status: DC

## 2021-08-10 MED ORDER — SULFAMETHOXAZOLE-TRIMETHOPRIM 800-160 MG PO TABS: 1.0000 | ORAL_TABLET | Freq: Two times a day (BID) | ORAL | 0 refills | Status: AC
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
## 2021-08-11 ENCOUNTER — Encounter: Payer: 59 | Admitting: Physical Therapy

## 2021-08-13 ENCOUNTER — Encounter: Payer: Self-pay | Admitting: Orthopedic Surgery
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
## 2021-08-15 ENCOUNTER — Encounter: Payer: 59 | Admitting: Family

## 2021-08-16 ENCOUNTER — Ambulatory Visit (INDEPENDENT_AMBULATORY_CARE_PROVIDER_SITE_OTHER): Payer: 59 | Admitting: Physical Therapy

## 2021-08-16 ENCOUNTER — Other Ambulatory Visit: Payer: Self-pay

## 2021-08-16 ENCOUNTER — Encounter: Payer: Self-pay | Admitting: Physical Therapy

## 2021-08-16 DIAGNOSIS — M25571 Pain in right ankle and joints of right foot: Secondary | ICD-10-CM | POA: Diagnosis not present

## 2021-08-16 DIAGNOSIS — M25671 Stiffness of right ankle, not elsewhere classified: Secondary | ICD-10-CM | POA: Diagnosis not present
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
## 2021-08-17 ENCOUNTER — Ambulatory Visit (INDEPENDENT_AMBULATORY_CARE_PROVIDER_SITE_OTHER): Payer: 59 | Admitting: Orthopedic Surgery

## 2021-08-17 DIAGNOSIS — T8131XD Disruption of external operation (surgical) wound, not elsewhere classified, subsequent encounter: Secondary | ICD-10-CM

## 2021-08-18 ENCOUNTER — Encounter: Payer: Self-pay | Admitting: Physical Therapy

## 2021-08-18 ENCOUNTER — Ambulatory Visit (INDEPENDENT_AMBULATORY_CARE_PROVIDER_SITE_OTHER): Payer: 59 | Admitting: Physical Therapy

## 2021-08-18 ENCOUNTER — Encounter: Payer: Self-pay | Admitting: Orthopedic Surgery

## 2021-08-18 ENCOUNTER — Other Ambulatory Visit: Payer: Self-pay

## 2021-08-18 DIAGNOSIS — M25671 Stiffness of right ankle, not elsewhere classified: Secondary | ICD-10-CM

## 2021-08-18 DIAGNOSIS — M25571 Pain in right ankle and joints of right foot: Secondary | ICD-10-CM
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
## 2021-08-21 ENCOUNTER — Ambulatory Visit (INDEPENDENT_AMBULATORY_CARE_PROVIDER_SITE_OTHER): Payer: 59 | Admitting: Orthopedic Surgery

## 2021-08-21 DIAGNOSIS — T8131XD Disruption of external operation (surgical) wound, not elsewhere classified, subsequent encounter: Secondary | ICD-10-CM

## 2021-08-23 ENCOUNTER — Other Ambulatory Visit: Payer: Self-pay

## 2021-08-23 ENCOUNTER — Encounter: Payer: Self-pay | Admitting: Physical Therapy

## 2021-08-23 ENCOUNTER — Ambulatory Visit (INDEPENDENT_AMBULATORY_CARE_PROVIDER_SITE_OTHER): Payer: 59 | Admitting: Physical Therapy

## 2021-08-23 ENCOUNTER — Encounter: Payer: Self-pay | Admitting: Orthopedic Surgery

## 2021-08-23 DIAGNOSIS — M25571 Pain in right ankle and joints of right foot: Secondary | ICD-10-CM | POA: Diagnosis not present

## 2021-08-23 DIAGNOSIS — R600 Localized edema: Secondary | ICD-10-CM

## 2021-08-23 DIAGNOSIS — M25671 Stiffness of right ankle, not elsewhere classified: Secondary | ICD-10-CM
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
## 2021-08-25 ENCOUNTER — Encounter: Payer: Self-pay | Admitting: Physical Therapy

## 2021-08-25 ENCOUNTER — Ambulatory Visit (INDEPENDENT_AMBULATORY_CARE_PROVIDER_SITE_OTHER): Payer: 59 | Admitting: Physical Therapy

## 2021-08-25 DIAGNOSIS — M25671 Stiffness of right ankle, not elsewhere classified: Secondary | ICD-10-CM

## 2021-08-25 DIAGNOSIS — M25571 Pain in right ankle and joints of right foot: Secondary | ICD-10-CM
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
## 2021-08-28 ENCOUNTER — Encounter: Payer: Self-pay | Admitting: Orthopedic Surgery

## 2021-08-28 ENCOUNTER — Ambulatory Visit (INDEPENDENT_AMBULATORY_CARE_PROVIDER_SITE_OTHER): Payer: 59 | Admitting: Orthopedic Surgery

## 2021-08-28 DIAGNOSIS — T8131XD Disruption of external operation (surgical) wound, not elsewhere classified, subsequent encounter: Secondary | ICD-10-CM

## 2021-08-28 DIAGNOSIS — S92041S Displaced other fracture of tuberosity of right calcaneus, sequela: Secondary | ICD-10-CM
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
## 2021-08-30 ENCOUNTER — Ambulatory Visit (INDEPENDENT_AMBULATORY_CARE_PROVIDER_SITE_OTHER): Payer: 59 | Admitting: Physical Therapy

## 2021-08-30 ENCOUNTER — Other Ambulatory Visit: Payer: Self-pay | Admitting: Family

## 2021-08-30 ENCOUNTER — Other Ambulatory Visit: Payer: Self-pay | Admitting: Orthopedic Surgery

## 2021-08-30 ENCOUNTER — Encounter: Payer: Self-pay | Admitting: Physical Therapy

## 2021-08-30 DIAGNOSIS — R600 Localized edema: Secondary | ICD-10-CM | POA: Diagnosis not present

## 2021-08-30 DIAGNOSIS — M25671 Stiffness of right ankle, not elsewhere classified: Secondary | ICD-10-CM | POA: Diagnosis not present

## 2021-08-30 DIAGNOSIS — M25571 Pain in right ankle and joints of right foot: Secondary | ICD-10-CM | POA: Diagnosis not present
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
## 2021-09-06 ENCOUNTER — Encounter: Payer: Self-pay | Admitting: Physical Therapy

## 2021-09-06 ENCOUNTER — Ambulatory Visit (INDEPENDENT_AMBULATORY_CARE_PROVIDER_SITE_OTHER): Payer: 59 | Admitting: Physical Therapy

## 2021-09-06 DIAGNOSIS — M25571 Pain in right ankle and joints of right foot: Secondary | ICD-10-CM

## 2021-09-06 DIAGNOSIS — M25671 Stiffness of right ankle, not elsewhere classified: Secondary | ICD-10-CM | POA: Diagnosis not present

## 2021-09-06 DIAGNOSIS — R600 Localized edema: Secondary | ICD-10-CM | POA: Diagnosis not present
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
## 2021-09-08 ENCOUNTER — Encounter: Payer: 59 | Admitting: Physical Therapy

## 2021-09-08 ENCOUNTER — Other Ambulatory Visit: Payer: Self-pay | Admitting: Orthopedic Surgery

## 2021-09-11 ENCOUNTER — Ambulatory Visit (INDEPENDENT_AMBULATORY_CARE_PROVIDER_SITE_OTHER): Payer: 59 | Admitting: Orthopedic Surgery

## 2021-09-11 DIAGNOSIS — T8131XD Disruption of external operation (surgical) wound, not elsewhere classified, subsequent encounter: Secondary | ICD-10-CM

## 2021-09-13 ENCOUNTER — Encounter: Payer: Self-pay | Admitting: Physical Therapy

## 2021-09-13 ENCOUNTER — Ambulatory Visit (INDEPENDENT_AMBULATORY_CARE_PROVIDER_SITE_OTHER): Payer: 59 | Admitting: Physical Therapy

## 2021-09-13 DIAGNOSIS — R600 Localized edema: Secondary | ICD-10-CM

## 2021-09-13 DIAGNOSIS — M25671 Stiffness of right ankle, not elsewhere classified: Secondary | ICD-10-CM | POA: Diagnosis not present

## 2021-09-13 DIAGNOSIS — M25571 Pain in right ankle and joints of right foot: Secondary | ICD-10-CM | POA: Diagnosis not present
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
## 2021-09-14 ENCOUNTER — Ambulatory Visit (INDEPENDENT_AMBULATORY_CARE_PROVIDER_SITE_OTHER): Payer: 59

## 2021-09-14 ENCOUNTER — Ambulatory Visit (INDEPENDENT_AMBULATORY_CARE_PROVIDER_SITE_OTHER): Payer: 59 | Admitting: Orthopedic Surgery

## 2021-09-14 DIAGNOSIS — M79671 Pain in right foot: Secondary | ICD-10-CM

## 2021-09-15 ENCOUNTER — Encounter: Payer: 59 | Admitting: Physical Therapy

## 2021-09-17 ENCOUNTER — Encounter: Payer: Self-pay | Admitting: Family
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
## 2021-09-24 ENCOUNTER — Encounter: Payer: Self-pay | Admitting: Orthopedic Surgery
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
## 2021-09-25 ENCOUNTER — Encounter: Payer: Self-pay | Admitting: Orthopedic Surgery

## 2021-09-25 ENCOUNTER — Ambulatory Visit (INDEPENDENT_AMBULATORY_CARE_PROVIDER_SITE_OTHER): Payer: 59 | Admitting: Orthopedic Surgery

## 2021-09-25 ENCOUNTER — Ambulatory Visit (INDEPENDENT_AMBULATORY_CARE_PROVIDER_SITE_OTHER): Payer: 59

## 2021-09-25 DIAGNOSIS — M79671 Pain in right foot: Secondary | ICD-10-CM | POA: Diagnosis not present

## 2021-09-25 DIAGNOSIS — S92041S Displaced other fracture of tuberosity of right calcaneus, sequela: Secondary | ICD-10-CM
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
## 2021-09-28 ENCOUNTER — Encounter: Payer: 59 | Admitting: Orthopedic Surgery

## 2021-10-03 ENCOUNTER — Ambulatory Visit (INDEPENDENT_AMBULATORY_CARE_PROVIDER_SITE_OTHER): Payer: 59 | Admitting: Physical Therapy

## 2021-10-03 ENCOUNTER — Encounter: Payer: Self-pay | Admitting: Physical Therapy

## 2021-10-03 DIAGNOSIS — M25571 Pain in right ankle and joints of right foot: Secondary | ICD-10-CM

## 2021-10-03 DIAGNOSIS — M25671 Stiffness of right ankle, not elsewhere classified: Secondary | ICD-10-CM

## 2021-10-03 DIAGNOSIS — R600 Localized edema: Secondary | ICD-10-CM | POA: Diagnosis not present

## 2021-10-03 NOTE — Addendum Note (Signed)
Addended by: Debbe Odea on: 10/03/2021 08:48 AM ? ? Modules accepted: Orders ? ?
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
## 2021-10-05 ENCOUNTER — Ambulatory Visit (INDEPENDENT_AMBULATORY_CARE_PROVIDER_SITE_OTHER): Payer: 59 | Admitting: Physical Therapy

## 2021-10-05 ENCOUNTER — Encounter: Payer: Self-pay | Admitting: Physical Therapy

## 2021-10-05 DIAGNOSIS — M25571 Pain in right ankle and joints of right foot: Secondary | ICD-10-CM

## 2021-10-05 DIAGNOSIS — M25671 Stiffness of right ankle, not elsewhere classified: Secondary | ICD-10-CM

## 2021-10-05 DIAGNOSIS — R600 Localized edema: Secondary | ICD-10-CM | POA: Diagnosis not present
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
## 2021-10-18 ENCOUNTER — Encounter: Payer: Self-pay | Admitting: Physical Therapy

## 2021-10-18 ENCOUNTER — Ambulatory Visit (INDEPENDENT_AMBULATORY_CARE_PROVIDER_SITE_OTHER): Payer: 59 | Admitting: Physical Therapy

## 2021-10-18 DIAGNOSIS — M25571 Pain in right ankle and joints of right foot: Secondary | ICD-10-CM

## 2021-10-18 DIAGNOSIS — M25671 Stiffness of right ankle, not elsewhere classified: Secondary | ICD-10-CM | POA: Diagnosis not present

## 2021-10-18 DIAGNOSIS — R600 Localized edema: Secondary | ICD-10-CM | POA: Diagnosis not present
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
## 2021-10-20 ENCOUNTER — Encounter: Payer: Self-pay | Admitting: Physical Therapy

## 2021-10-20 ENCOUNTER — Ambulatory Visit (INDEPENDENT_AMBULATORY_CARE_PROVIDER_SITE_OTHER): Payer: 59 | Admitting: Physical Therapy

## 2021-10-20 DIAGNOSIS — R600 Localized edema: Secondary | ICD-10-CM

## 2021-10-20 DIAGNOSIS — M25671 Stiffness of right ankle, not elsewhere classified: Secondary | ICD-10-CM

## 2021-10-20 DIAGNOSIS — M25571 Pain in right ankle and joints of right foot: Secondary | ICD-10-CM
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
## 2021-10-25 ENCOUNTER — Encounter: Payer: Self-pay | Admitting: Physical Therapy

## 2021-10-25 ENCOUNTER — Ambulatory Visit (INDEPENDENT_AMBULATORY_CARE_PROVIDER_SITE_OTHER): Payer: 59 | Admitting: Physical Therapy

## 2021-10-25 DIAGNOSIS — M25671 Stiffness of right ankle, not elsewhere classified: Secondary | ICD-10-CM

## 2021-10-25 DIAGNOSIS — M25571 Pain in right ankle and joints of right foot: Secondary | ICD-10-CM

## 2021-10-25 DIAGNOSIS — R600 Localized edema: Secondary | ICD-10-CM | POA: Diagnosis not present
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
## 2021-10-26 ENCOUNTER — Ambulatory Visit (INDEPENDENT_AMBULATORY_CARE_PROVIDER_SITE_OTHER): Payer: 59 | Admitting: Orthopedic Surgery

## 2021-10-26 ENCOUNTER — Ambulatory Visit: Payer: Self-pay

## 2021-10-26 DIAGNOSIS — S92041S Displaced other fracture of tuberosity of right calcaneus, sequela: Secondary | ICD-10-CM

## 2021-10-26 DIAGNOSIS — T8131XD Disruption of external operation (surgical) wound, not elsewhere classified, subsequent encounter: Secondary | ICD-10-CM

## 2021-10-26 DIAGNOSIS — Z9889 Other specified postprocedural states: Secondary | ICD-10-CM

## 2021-10-27 ENCOUNTER — Encounter: Payer: Self-pay | Admitting: Rehabilitative and Restorative Service Providers"

## 2021-10-27 ENCOUNTER — Ambulatory Visit (INDEPENDENT_AMBULATORY_CARE_PROVIDER_SITE_OTHER): Payer: 59 | Admitting: Rehabilitative and Restorative Service Providers"

## 2021-10-27 ENCOUNTER — Encounter: Payer: Self-pay | Admitting: Orthopedic Surgery

## 2021-10-27 DIAGNOSIS — M25671 Stiffness of right ankle, not elsewhere classified: Secondary | ICD-10-CM | POA: Diagnosis not present

## 2021-10-27 DIAGNOSIS — R600 Localized edema: Secondary | ICD-10-CM | POA: Diagnosis not present

## 2021-10-27 DIAGNOSIS — M25571 Pain in right ankle and joints of right foot: Secondary | ICD-10-CM | POA: Diagnosis not present
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
## 2021-10-31 ENCOUNTER — Ambulatory Visit (INDEPENDENT_AMBULATORY_CARE_PROVIDER_SITE_OTHER): Payer: 59 | Admitting: Orthopedic Surgery

## 2021-10-31 ENCOUNTER — Ambulatory Visit (INDEPENDENT_AMBULATORY_CARE_PROVIDER_SITE_OTHER): Payer: 59

## 2021-10-31 ENCOUNTER — Encounter: Payer: 59 | Admitting: Physical Therapy

## 2021-10-31 ENCOUNTER — Encounter: Payer: Self-pay | Admitting: Family

## 2021-10-31 DIAGNOSIS — T8131XD Disruption of external operation (surgical) wound, not elsewhere classified, subsequent encounter: Secondary | ICD-10-CM

## 2021-10-31 DIAGNOSIS — Z9889 Other specified postprocedural states: Secondary | ICD-10-CM

## 2021-10-31 MED ORDER — MUPIROCIN 2 % EX OINT: 1.0000 "application " | TOPICAL_OINTMENT | Freq: Two times a day (BID) | CUTANEOUS | 3 refills | Status: AC

## 2021-10-31 MED ORDER — DOXYCYCLINE HYCLATE 100 MG PO TABS: 100.0000 mg | ORAL_TABLET | Freq: Two times a day (BID) | ORAL | 0 refills | Status: AC
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
## 2021-11-02 ENCOUNTER — Encounter: Payer: 59 | Admitting: Physical Therapy

## 2021-11-09 ENCOUNTER — Ambulatory Visit (INDEPENDENT_AMBULATORY_CARE_PROVIDER_SITE_OTHER): Payer: 59 | Admitting: Orthopedic Surgery

## 2021-11-09 DIAGNOSIS — Z9889 Other specified postprocedural states: Secondary | ICD-10-CM | POA: Diagnosis not present

## 2021-11-09 DIAGNOSIS — M25561 Pain in right knee: Secondary | ICD-10-CM | POA: Diagnosis not present

## 2021-11-09 DIAGNOSIS — S92041S Displaced other fracture of tuberosity of right calcaneus, sequela: Secondary | ICD-10-CM | POA: Diagnosis not present

## 2021-11-12 ENCOUNTER — Encounter: Payer: Self-pay | Admitting: Orthopedic Surgery

## 2021-11-12 DIAGNOSIS — M25561 Pain in right knee: Secondary | ICD-10-CM | POA: Diagnosis not present

## 2021-11-12 MED ORDER — METHYLPREDNISOLONE ACETATE 40 MG/ML IJ SUSP
40.0000 mg | INTRAMUSCULAR | Status: AC | PRN
  Administered 2021-11-12: 40 mg via INTRA_ARTICULAR

## 2021-11-12 MED ORDER — LIDOCAINE HCL (PF) 1 % IJ SOLN
5.0000 mL | INTRAMUSCULAR | Status: AC | PRN
  Administered 2021-11-12: 5 mL
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
## 2021-12-14 ENCOUNTER — Ambulatory Visit (INDEPENDENT_AMBULATORY_CARE_PROVIDER_SITE_OTHER): Payer: 59 | Admitting: Orthopedic Surgery

## 2021-12-14 ENCOUNTER — Encounter: Payer: Self-pay | Admitting: Orthopedic Surgery

## 2021-12-14 DIAGNOSIS — M25561 Pain in right knee: Secondary | ICD-10-CM

## 2021-12-14 DIAGNOSIS — S83241D Other tear of medial meniscus, current injury, right knee, subsequent encounter: Secondary | ICD-10-CM

## 2021-12-14 DIAGNOSIS — Z9889 Other specified postprocedural states: Secondary | ICD-10-CM
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
## 2021-12-26 ENCOUNTER — Ambulatory Visit
Admission: RE | Admit: 2021-12-26 | Discharge: 2021-12-26 | Disposition: A | Discharge status: Home / Self Care | Payer: 59 | Source: Ambulatory Visit | Attending: Orthopedic Surgery | Admitting: Orthopedic Surgery

## 2021-12-26 DIAGNOSIS — S83241D Other tear of medial meniscus, current injury, right knee, subsequent encounter: Secondary | ICD-10-CM

## 2021-12-26 DIAGNOSIS — M25561 Pain in right knee: Secondary | ICD-10-CM

## 2022-01-04 ENCOUNTER — Ambulatory Visit (INDEPENDENT_AMBULATORY_CARE_PROVIDER_SITE_OTHER): Payer: 59 | Admitting: Orthopedic Surgery

## 2022-01-04 ENCOUNTER — Encounter: Payer: Self-pay | Admitting: Orthopedic Surgery

## 2022-01-04 DIAGNOSIS — M25561 Pain in right knee: Secondary | ICD-10-CM

## 2022-01-04 DIAGNOSIS — M25571 Pain in right ankle and joints of right foot: Secondary | ICD-10-CM | POA: Diagnosis not present
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
## 2022-01-25 ENCOUNTER — Encounter: Payer: Self-pay | Admitting: Physical Therapy

## 2022-01-25 ENCOUNTER — Ambulatory Visit (INDEPENDENT_AMBULATORY_CARE_PROVIDER_SITE_OTHER): Payer: 59 | Admitting: Physical Therapy

## 2022-01-25 ENCOUNTER — Other Ambulatory Visit: Payer: Self-pay

## 2022-01-25 DIAGNOSIS — M25671 Stiffness of right ankle, not elsewhere classified: Secondary | ICD-10-CM | POA: Diagnosis not present

## 2022-01-25 DIAGNOSIS — M25561 Pain in right knee: Secondary | ICD-10-CM

## 2022-01-25 DIAGNOSIS — R600 Localized edema: Secondary | ICD-10-CM

## 2022-01-25 DIAGNOSIS — M25571 Pain in right ankle and joints of right foot: Secondary | ICD-10-CM | POA: Diagnosis not present

## 2022-01-25 DIAGNOSIS — G8929 Other chronic pain: Secondary | ICD-10-CM
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
## 2022-02-07 ENCOUNTER — Telehealth: Payer: Self-pay | Admitting: Physical Therapy

## 2022-02-07 ENCOUNTER — Encounter: Payer: 59 | Admitting: Physical Therapy

## 2022-02-07 NOTE — Telephone Encounter (Signed)
Called pt as he did not show for his PT appt.  He reports he meant to call earlier to cancel as they changed his work shift.  Reminded of next scheduled appt.    Clarita Crane, PT, DPT 02/07/22 3:33 PM

## 2022-02-15 ENCOUNTER — Encounter: Payer: 59 | Admitting: Physical Therapy

## 2022-02-22 ENCOUNTER — Encounter: Payer: 59 | Admitting: Physical Therapy
